# Patient Record
Sex: Female | Born: 1960
Health system: Southern US, Community
[De-identification: ages and names within clinical notes are randomized; demographics above are authoritative.]

## PROBLEM LIST (undated history)

## (undated) DIAGNOSIS — M549 Dorsalgia, unspecified: Secondary | ICD-10-CM

## (undated) DIAGNOSIS — I10 Essential (primary) hypertension: Secondary | ICD-10-CM

## (undated) DIAGNOSIS — M5126 Other intervertebral disc displacement, lumbar region: Secondary | ICD-10-CM

## (undated) DIAGNOSIS — R002 Palpitations: Secondary | ICD-10-CM

## (undated) DIAGNOSIS — N809 Endometriosis, unspecified: Secondary | ICD-10-CM

## (undated) DIAGNOSIS — E7800 Pure hypercholesterolemia, unspecified: Secondary | ICD-10-CM

## (undated) DIAGNOSIS — M5410 Radiculopathy, site unspecified: Secondary | ICD-10-CM

## (undated) HISTORY — DX: Other intervertebral disc displacement, lumbar region: M51.26

## (undated) HISTORY — DX: Endometriosis, unspecified: N80.9

## (undated) HISTORY — DX: Radiculopathy, site unspecified: M54.10

## (undated) HISTORY — DX: Pure hypercholesterolemia, unspecified: E78.00

## (undated) HISTORY — PX: OTHER SURGICAL HISTORY: SHX169

## (undated) HISTORY — PX: REDUCTION MAMMAPLASTY: SUR839

## (undated) HISTORY — DX: Palpitations: R00.2

## (undated) HISTORY — DX: Dorsalgia, unspecified: M54.9

---

## 1990-10-05 HISTORY — PX: LAPAROSCOPIC ENDOMETRIOSIS FULGURATION: SUR769

## 2010-10-05 HISTORY — PX: BREAST REDUCTION SURGERY: SHX8

## 2015-02-13 ENCOUNTER — Other Ambulatory Visit (HOSPITAL_COMMUNITY)
Admission: RE | Admit: 2015-02-13 | Discharge: 2015-02-13 | Disposition: A | Discharge status: Home / Self Care | Payer: Commercial Managed Care - PPO | Source: Ambulatory Visit | Attending: Obstetrics & Gynecology | Admitting: Obstetrics & Gynecology

## 2015-02-13 DIAGNOSIS — Z1151 Encounter for screening for human papillomavirus (HPV): Secondary | ICD-10-CM | POA: Diagnosis present

## 2015-02-13 DIAGNOSIS — Z01419 Encounter for gynecological examination (general) (routine) without abnormal findings: Secondary | ICD-10-CM | POA: Insufficient documentation

## 2015-02-18 ENCOUNTER — Other Ambulatory Visit: Payer: Self-pay

## 2015-02-18 DIAGNOSIS — Z1231 Encounter for screening mammogram for malignant neoplasm of breast: Secondary | ICD-10-CM

## 2015-03-15 ENCOUNTER — Ambulatory Visit: Payer: Self-pay

## 2015-04-16 ENCOUNTER — Ambulatory Visit: Payer: Commercial Managed Care - PPO

## 2015-04-19 ENCOUNTER — Ambulatory Visit: Payer: Commercial Managed Care - PPO

## 2015-05-24 ENCOUNTER — Ambulatory Visit: Payer: Commercial Managed Care - PPO

## 2015-07-03 ENCOUNTER — Ambulatory Visit: Payer: Commercial Managed Care - PPO

## 2015-08-02 ENCOUNTER — Ambulatory Visit
Admission: RE | Admit: 2015-08-02 | Discharge: 2015-08-02 | Disposition: A | Discharge status: Home / Self Care | Payer: Commercial Managed Care - PPO | Source: Ambulatory Visit

## 2015-08-02 DIAGNOSIS — Z1231 Encounter for screening mammogram for malignant neoplasm of breast: Secondary | ICD-10-CM

## 2015-08-14 ENCOUNTER — Ambulatory Visit: Payer: Commercial Managed Care - PPO | Admitting: Podiatry

## 2015-08-15 ENCOUNTER — Ambulatory Visit: Payer: Commercial Managed Care - PPO | Admitting: Podiatry

## 2015-11-10 ENCOUNTER — Emergency Department (HOSPITAL_BASED_OUTPATIENT_CLINIC_OR_DEPARTMENT_OTHER)
Admission: EM | Admit: 2015-11-10 | Discharge: 2015-11-10 | Disposition: A | Discharge status: Home / Self Care | Payer: Commercial Managed Care - PPO | Attending: Emergency Medicine | Admitting: Emergency Medicine

## 2015-11-10 ENCOUNTER — Encounter (HOSPITAL_BASED_OUTPATIENT_CLINIC_OR_DEPARTMENT_OTHER): Payer: Self-pay | Admitting: *Deleted

## 2015-11-10 ENCOUNTER — Emergency Department (HOSPITAL_BASED_OUTPATIENT_CLINIC_OR_DEPARTMENT_OTHER): Payer: Commercial Managed Care - PPO

## 2015-11-10 DIAGNOSIS — M62830 Muscle spasm of back: Secondary | ICD-10-CM | POA: Diagnosis not present

## 2015-11-10 DIAGNOSIS — S6992XA Unspecified injury of left wrist, hand and finger(s), initial encounter: Secondary | ICD-10-CM | POA: Insufficient documentation

## 2015-11-10 DIAGNOSIS — Y998 Other external cause status: Secondary | ICD-10-CM | POA: Insufficient documentation

## 2015-11-10 DIAGNOSIS — M79641 Pain in right hand: Secondary | ICD-10-CM

## 2015-11-10 DIAGNOSIS — I10 Essential (primary) hypertension: Secondary | ICD-10-CM | POA: Insufficient documentation

## 2015-11-10 DIAGNOSIS — M545 Low back pain, unspecified: Secondary | ICD-10-CM

## 2015-11-10 DIAGNOSIS — S3992XA Unspecified injury of lower back, initial encounter: Secondary | ICD-10-CM | POA: Diagnosis present

## 2015-11-10 DIAGNOSIS — Z79899 Other long term (current) drug therapy: Secondary | ICD-10-CM | POA: Diagnosis not present

## 2015-11-10 DIAGNOSIS — Y9389 Activity, other specified: Secondary | ICD-10-CM | POA: Insufficient documentation

## 2015-11-10 DIAGNOSIS — S6991XA Unspecified injury of right wrist, hand and finger(s), initial encounter: Secondary | ICD-10-CM | POA: Diagnosis not present

## 2015-11-10 DIAGNOSIS — M79642 Pain in left hand: Secondary | ICD-10-CM

## 2015-11-10 DIAGNOSIS — S0993XA Unspecified injury of face, initial encounter: Secondary | ICD-10-CM | POA: Diagnosis not present

## 2015-11-10 DIAGNOSIS — S29001A Unspecified injury of muscle and tendon of front wall of thorax, initial encounter: Secondary | ICD-10-CM | POA: Insufficient documentation

## 2015-11-10 DIAGNOSIS — Y92410 Unspecified street and highway as the place of occurrence of the external cause: Secondary | ICD-10-CM | POA: Diagnosis not present

## 2015-11-10 HISTORY — DX: Essential (primary) hypertension: I10

## 2015-11-10 MED ORDER — NAPROXEN 375 MG PO TABS: 375.0000 mg | ORAL_TABLET | Freq: Two times a day (BID) | ORAL | Status: DC

## 2015-11-10 MED ORDER — IBUPROFEN 400 MG PO TABS
400.0000 mg | ORAL_TABLET | Freq: Once | ORAL | Status: DC
  Filled 2015-11-10: qty 1

## 2015-11-10 MED ORDER — CYCLOBENZAPRINE HCL 5 MG PO TABS: 5.0000 mg | ORAL_TABLET | Freq: Two times a day (BID) | ORAL | Status: DC | PRN
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
## 2015-11-10 NOTE — Discharge Instructions (Signed)
Do not drive while taking the muscle relaxant as it will make you sleepy. Follow up with your doctor or return here as needed for problems.

## 2015-11-18 DIAGNOSIS — S335XXA Sprain of ligaments of lumbar spine, initial encounter: Secondary | ICD-10-CM | POA: Insufficient documentation

## 2015-11-18 DIAGNOSIS — S134XXA Sprain of ligaments of cervical spine, initial encounter: Secondary | ICD-10-CM | POA: Insufficient documentation

## 2016-02-20 ENCOUNTER — Ambulatory Visit (INDEPENDENT_AMBULATORY_CARE_PROVIDER_SITE_OTHER): Payer: Commercial Managed Care - PPO | Admitting: Neurology

## 2016-02-20 ENCOUNTER — Encounter: Payer: Self-pay | Admitting: Neurology

## 2016-02-20 VITALS — BP 108/63 | HR 70 | Ht 64.0 in | Wt 133.4 lb

## 2016-02-20 DIAGNOSIS — G5622 Lesion of ulnar nerve, left upper limb: Secondary | ICD-10-CM

## 2016-02-20 DIAGNOSIS — H02402 Unspecified ptosis of left eyelid: Secondary | ICD-10-CM | POA: Diagnosis not present

## 2016-02-20 DIAGNOSIS — R200 Anesthesia of skin: Secondary | ICD-10-CM

## 2016-02-20 DIAGNOSIS — H93A2 Pulsatile tinnitus, left ear: Secondary | ICD-10-CM

## 2016-02-20 DIAGNOSIS — R202 Paresthesia of skin: Secondary | ICD-10-CM | POA: Diagnosis not present

## 2016-02-20 DIAGNOSIS — R29898 Other symptoms and signs involving the musculoskeletal system: Secondary | ICD-10-CM

## 2016-02-20 DIAGNOSIS — I671 Cerebral aneurysm, nonruptured: Secondary | ICD-10-CM | POA: Diagnosis not present

## 2016-02-20 DIAGNOSIS — R42 Dizziness and giddiness: Secondary | ICD-10-CM | POA: Diagnosis not present

## 2016-02-20 DIAGNOSIS — H4902 Third [oculomotor] nerve palsy, left eye: Secondary | ICD-10-CM

## 2016-02-20 DIAGNOSIS — H905 Unspecified sensorineural hearing loss: Secondary | ICD-10-CM

## 2016-02-20 DIAGNOSIS — H9190 Unspecified hearing loss, unspecified ear: Secondary | ICD-10-CM
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
## 2016-02-20 NOTE — Patient Instructions (Addendum)
Remember to drink plenty of fluid, eat healthy meals and do not skip any meals. Try to eat protein with a every meal and eat a healthy snack such as fruit or nuts in between meals. Try to keep a regular sleep-wake schedule and try to exercise daily, particularly in the form of walking, 20-30 minutes a day, if you can.   As far as diagnostic testing: MRI of the brain and MRA of the head, emg/ncs, lab  Our phone number is [phone]. We also have an after hours call service for urgent matters and there is a physician on-call for urgent questions. For any emergencies you know to call 911 or go to the nearest emergency room

## 2016-02-21 ENCOUNTER — Ambulatory Visit: Payer: Commercial Managed Care - PPO | Admitting: Neurology

## 2016-02-21 ENCOUNTER — Telehealth: Payer: Self-pay

## 2016-02-21 LAB — BASIC METABOLIC PANEL
BUN / CREAT RATIO: 12 (ref 9–23)
BUN: 11 mg/dL (ref 6–24)
CHLORIDE: 103 mmol/L (ref 96–106)
CO2: 23 mmol/L (ref 18–29)
Calcium: 9.3 mg/dL (ref 8.7–10.2)
Creatinine, Ser: 0.94 mg/dL (ref 0.57–1.00)
GFR calc non Af Amer: 69 mL/min/{1.73_m2} (ref >59)
GFR, EST AFRICAN AMERICAN: 79 mL/min/{1.73_m2} (ref >59)
Glucose: 83 mg/dL (ref 65–99)
POTASSIUM: 4.5 mmol/L (ref 3.5–5.2)
Sodium: 142 mmol/L (ref 134–144)

## 2016-02-21 NOTE — Telephone Encounter (Signed)
-----   Message from Anson FretAntonia B Ahern, MD sent at 02/21/2016  7:58 AM EDT ----- Labs normal thanks

## 2016-02-21 NOTE — Telephone Encounter (Signed)
I spoke to patient and she is aware of results.  

## 2016-02-23 ENCOUNTER — Encounter: Payer: Self-pay | Admitting: Neurology

## 2016-02-23 DIAGNOSIS — R202 Paresthesia of skin: Secondary | ICD-10-CM | POA: Insufficient documentation

## 2016-02-23 DIAGNOSIS — H02409 Unspecified ptosis of unspecified eyelid: Secondary | ICD-10-CM | POA: Insufficient documentation

## 2016-02-26 ENCOUNTER — Other Ambulatory Visit: Payer: Commercial Managed Care - PPO

## 2016-02-27 ENCOUNTER — Telehealth: Payer: Self-pay | Admitting: Neurology

## 2016-02-27 NOTE — Telephone Encounter (Signed)
Called patient to inquire about which facility she wanted to be referred to for her MRI. Please ask her route message to me. Thanks!

## 2016-03-08 ENCOUNTER — Inpatient Hospital Stay: Admission: RE | Admit: 2016-03-08 | Payer: Commercial Managed Care - PPO | Source: Ambulatory Visit

## 2016-03-08 ENCOUNTER — Other Ambulatory Visit: Payer: Commercial Managed Care - PPO

## 2016-03-10 NOTE — Telephone Encounter (Signed)
Pt called to speak with Duwayne Heckanielle, sts out of pocket for MRI is $2000 and she is wanting to r/s to another facility. Please call

## 2016-03-13 ENCOUNTER — Ambulatory Visit (INDEPENDENT_AMBULATORY_CARE_PROVIDER_SITE_OTHER): Payer: Commercial Managed Care - PPO | Admitting: Diagnostic Neuroimaging

## 2016-03-13 ENCOUNTER — Encounter (INDEPENDENT_AMBULATORY_CARE_PROVIDER_SITE_OTHER): Payer: Self-pay | Admitting: Diagnostic Neuroimaging

## 2016-03-13 DIAGNOSIS — R200 Anesthesia of skin: Secondary | ICD-10-CM

## 2016-03-13 DIAGNOSIS — G5622 Lesion of ulnar nerve, left upper limb: Secondary | ICD-10-CM

## 2016-03-13 DIAGNOSIS — R202 Paresthesia of skin: Secondary | ICD-10-CM

## 2016-03-13 DIAGNOSIS — R29898 Other symptoms and signs involving the musculoskeletal system: Secondary | ICD-10-CM

## 2016-03-13 DIAGNOSIS — Z0289 Encounter for other administrative examinations: Secondary | ICD-10-CM

## 2016-03-13 NOTE — Procedures (Signed)
   [facility]  NCS (NERVE CONDUCTION STUDY) WITH EMG (ELECTROMYOGRAPHY) REPORT   STUDY DATE: 03/13/16 PATIENT NAME: Tonya Henry DOB: 1961-08-31 MRN: [id number]  ORDERING CLINICIAN: Joycelyn SchmidVikram Camarie Mctigue, MD   TECHNOLOGIST: Gearldine ShownLorraine Jones  ELECTROMYOGRAPHER: Glenford BayleyVikram R. Najma Bozarth, MD  CLINICAL INFORMATION: 55 year old female with left arm and left leg numbness and heaviness and pain.   FINDINGS: NERVE CONDUCTION STUDY: Bilateral median and ulnar motor responses and F wave latencies are normal. Bilateral median and ulnar sensory responses are normal.  Left peroneal, left tibial motor responses and F wave latencies are normal. Left peroneal sensory response is normal.   NEEDLE ELECTROMYOGRAPHY: Needle examination of left upper extremity ultra, biceps, triceps, flexor carpi radialis, first dorsal interosseous is normal. Patient denies neck pain and therefore cervical paraspinal muscles were deferred.   IMPRESSION:  This is a normal study. No electrodiagnostic evidence of large fiber neuropathy at this time.   INTERPRETING PHYSICIAN:  Suanne MarkerVIKRAM R. Mervyn Pflaum, MD Certified in Neurology, Neurophysiology and Neuroimaging  [facility] [address], [address] [place], [place] [address] [phone]

## 2016-03-13 NOTE — Telephone Encounter (Signed)
Call[facility] @ 8:30am left a VM with this information . DW

## 2016-03-17 ENCOUNTER — Telehealth: Payer: Self-pay | Admitting: *Deleted

## 2016-03-17 NOTE — Telephone Encounter (Signed)
Dr Lucia GaskinsAhern- can you place order for MRI cervical? Also, please advise.  Called and spoke to pt about results per Dr Lucia GaskinsAhern note below. Pt verbalized understanding and willing to have MRI cervical spine. She is questioning if she still needs to have MRI brain. Advised I will send phone note to Dr Lucia GaskinsAhern and call her back to let her know.

## 2016-03-17 NOTE — Telephone Encounter (Signed)
-----   Message from Anson FretAntonia B Ahern, MD sent at 03/16/2016  5:12 PM EDT ----- Kara MeadEmma, patient's emg/ncs was normal. No evidence for nerve damage or ulnar or median neuropathy. We could MRI her cervical spine next to ensure there is no radiculopathy/ pinched nerve in the neck Let me know how she would like to proceed. .Marland Kitchen

## 2016-03-18 ENCOUNTER — Other Ambulatory Visit: Payer: Self-pay | Admitting: Neurology

## 2016-03-18 DIAGNOSIS — R29898 Other symptoms and signs involving the musculoskeletal system: Secondary | ICD-10-CM

## 2016-03-18 DIAGNOSIS — R202 Paresthesia of skin: Secondary | ICD-10-CM

## 2016-03-18 DIAGNOSIS — M5010 Cervical disc disorder with radiculopathy, unspecified cervical region: Secondary | ICD-10-CM

## 2016-03-18 NOTE — Telephone Encounter (Signed)
Called and spoke to pt. Relayed Dr Lucia GaskinsAhern message below. Pt willing to do both. Advised pt she will be called to scheduled MRI cervical. She verbalized understanding.

## 2016-03-18 NOTE — Telephone Encounter (Signed)
I would do both as long as it is not prohibitively expensive.

## 2016-03-18 NOTE — Telephone Encounter (Signed)
Dr Ahern- FYI 

## 2017-06-23 ENCOUNTER — Ambulatory Visit (INDEPENDENT_AMBULATORY_CARE_PROVIDER_SITE_OTHER): Payer: BLUE CROSS/BLUE SHIELD | Admitting: Certified Nurse Midwife

## 2017-06-23 ENCOUNTER — Other Ambulatory Visit: Payer: Self-pay | Admitting: Certified Nurse Midwife

## 2017-06-23 ENCOUNTER — Other Ambulatory Visit (HOSPITAL_COMMUNITY)
Admission: RE | Admit: 2017-06-23 | Discharge: 2017-06-23 | Disposition: A | Discharge status: Home / Self Care | Payer: BLUE CROSS/BLUE SHIELD | Source: Ambulatory Visit | Attending: Certified Nurse Midwife | Admitting: Certified Nurse Midwife

## 2017-06-23 ENCOUNTER — Encounter: Payer: Self-pay | Admitting: Certified Nurse Midwife

## 2017-06-23 VITALS — BP 120/78 | HR 68 | Resp 16 | Ht 63.75 in | Wt 138.0 lb

## 2017-06-23 DIAGNOSIS — N951 Menopausal and female climacteric states: Secondary | ICD-10-CM

## 2017-06-23 DIAGNOSIS — Z01419 Encounter for gynecological examination (general) (routine) without abnormal findings: Secondary | ICD-10-CM | POA: Diagnosis not present

## 2017-06-23 DIAGNOSIS — Z124 Encounter for screening for malignant neoplasm of cervix: Secondary | ICD-10-CM

## 2017-06-23 DIAGNOSIS — Z1231 Encounter for screening mammogram for malignant neoplasm of breast: Secondary | ICD-10-CM

## 2017-06-23 NOTE — Patient Instructions (Signed)

## 2017-06-23 NOTE — Progress Notes (Signed)
Call to the [facility] while patient is in the office today. Breast Center offered appointment 9/26,9/27,9/28. Patient declines all appointments due to work schedule. Patient states she will then be going out of the country for 6 months. Declines to schedule mammogram at this time. States she will contact the Breast Center to schedule a mammogram at a later time.

## 2017-06-23 NOTE — Progress Notes (Signed)
56 y.o. G2P0 Single  African American Fe here to establish care and  for annual exam. Menopausal no vaginal bleeding or vaginal dryness. Sees Dr. Tenny Craw for aex, labs and medication management of hypertension, cholesterol and insomnia. All stable per patient. Patient aware mammogram due. Not sexually active. No other health issues today.  Patient's last menstrual period was 10/06/2007 (exact date).          Sexually active: No.  The current method of family planning is post menopausal status.    Exercising: No.  exercise Smoker:  no  Health Maintenance: Pap:  ago neg per patient History of Abnormal Pap: no MMG:  08-02-15 category b density birads 1:neg Self Breast exams: yes Colonoscopy:  2006 f/u 20yrs  BMD:   none TDaP:  2016 Shingles: no Pneumonia: no Hep C and HIV: not done Labs: if needed   reports that she has never smoked. She has never used smokeless tobacco. She reports that she does not drink alcohol or use drugs.  Past Medical History:  Diagnosis Date  . Back pain   . Endometriosis   . High cholesterol   . Hypertension   . Lumbar herniated disc    L4 & 5  . Radicular syndrome     Past Surgical History:  Procedure Laterality Date  . BREAST REDUCTION SURGERY  2012  . LAPAROSCOPIC ENDOMETRIOSIS FULGURATION  1992  . pinguecula removal  09/2015, 12/2005    Current Outpatient Prescriptions  Medication Sig Dispense Refill  . amLODipine (NORVASC) 5 MG tablet Take 5 mg by mouth daily.    Marland Kitchen gabapentin (NEURONTIN) 300 MG capsule Take 300 mg by mouth as needed.    Marland Kitchen lisinopril (PRINIVIL,ZESTRIL) 5 MG tablet Take 5 mg by mouth daily.    . metoprolol succinate (TOPROL-XL) 25 MG 24 hr tablet   3  . zolpidem (AMBIEN) 10 MG tablet as needed.  0   No current facility-administered medications for this visit.     Family History  Problem Relation Age of Onset  . Diabetes Mother   . Hypertension Mother   . Heart disease Mother   . Hypertension Unknown   . Kidney  disease Unknown   . Diabetes Unknown   . Breast cancer Unknown   . Breast cancer Sister     ROS:  Pertinent items are noted in HPI.  Otherwise, a comprehensive ROS was negative.  Exam:   BP 120/78   Pulse 68   Resp 16   Ht 5' 3.75" (1.619 m)   Wt 138 lb (62.6 kg)   LMP 10/06/2007 (Exact Date)   BMI 23.87 kg/m  Height: 5' 3.75" (161.9 cm) Ht Readings from Last 3 Encounters:  06/23/17 5' 3.75" (1.619 m)  02/20/16  (1.626 m)  11/10/15  (1.626 m)    General appearance: alert, cooperative and appears stated age Head: Normocephalic, without obvious abnormality, atraumatic Neck: no adenopathy, supple, symmetrical, trachea midline and thyroid normal to inspection and palpation Lungs: clear to auscultation bilaterally Breasts: normal appearance, no masses or tenderness, No nipple retraction or dimpling, No nipple discharge or bleeding, No axillary or supraclavicular adenopathy, breast reduction scarring noted bilateral Heart: regular rate and rhythm Abdomen: soft, non-tender; no masses,  no organomegaly Extremities: extremities normal, atraumatic, no cyanosis or edema Skin: Skin color, texture, turgor normal. No rashes or lesions Lymph nodes: Cervical, supraclavicular, and axillary nodes normal. No abnormal inguinal nodes palpated Neurologic: Grossly normal   Pelvic: External genitalia:  no lesions  Urethra:  normal appearing urethra with no masses, tenderness or lesions              Bartholin's and Skene's: normal                 Vagina: normal appearing vagina with normal color and discharge, no lesions              Cervix: no bleeding following Pap, no cervical motion tenderness, no lesions and nulliparous appearance              Pap taken: Yes.   Bimanual Exam:  Uterus:  normal size, contour, position, consistency, mobility, non-tender              Adnexa: normal adnexa and no mass, fullness, tenderness               Rectovaginal: Confirms                Anus:  normal sphincter tone, no lesions  Chaperone present: yes  A:  Well Woman with normal exam  Menopausal no HRT  Mammogram due  Colonoscopy due  Hypertension,cholesterol/insomina with PCP management  Family history of breast cancer sister age 55 genetic screening unknown  P:   Reviewed health and wellness pertinent to exam  Discussed importance of being sure to advise if vaginal bleeding, now that she is menopausal.  Patient will be scheduled prior to leaving today.  Patient request referral for colonoscopy after discussion of risks/benefits  Continue follow up with PCP as indicated  Discussed genetic screening available due to history of breast cancer in family. Patient will consider.  Pap smear: yes   counseled on breast self exam, mammography screening, feminine hygiene, menopause, adequate intake of calcium and vitamin D, diet and exercise  return annually or prn  An After Visit Summary was printed and given to the patient.

## 2017-06-25 LAB — CYTOLOGY - PAP
Diagnosis: NEGATIVE
HPV (WINDOPATH): NOT DETECTED

## 2017-07-02 ENCOUNTER — Ambulatory Visit
Admission: RE | Admit: 2017-07-02 | Discharge: 2017-07-02 | Disposition: A | Discharge status: Home / Self Care | Payer: BLUE CROSS/BLUE SHIELD | Source: Ambulatory Visit | Attending: Certified Nurse Midwife | Admitting: Certified Nurse Midwife

## 2017-07-02 ENCOUNTER — Ambulatory Visit: Payer: Commercial Managed Care - PPO

## 2017-07-02 ENCOUNTER — Other Ambulatory Visit: Payer: Self-pay | Admitting: Certified Nurse Midwife

## 2017-07-02 DIAGNOSIS — Z1231 Encounter for screening mammogram for malignant neoplasm of breast: Secondary | ICD-10-CM

## 2017-08-13 ENCOUNTER — Telehealth: Payer: Self-pay | Admitting: Certified Nurse Midwife

## 2017-08-13 NOTE — Telephone Encounter (Signed)
Left message to call Noreene LarssonJill at [phone].   Notes recorded by Verner CholLeonard, Deborah S, CNM on 06/26/2017 at 5:31 PM EDT Pap smear reviewed negative. HPVHR not detected 02  Notes recorded by Verner CholLeonard, Deborah S, CNM on 07/02/2017 at 5:24 PM EDT Mammogram reviewed negative birads a Density b Yearly mammogram

## 2017-08-13 NOTE — Telephone Encounter (Signed)
Return call to Jill. °

## 2017-08-13 NOTE — Telephone Encounter (Signed)
Spoke with patient, states she did not receive PAP and MMG results. Advised of results as seen below. Patient verbalizes understanding and is agreeable.   Routing to provider for final review. Patient is agreeable to disposition. Will close encounter.

## 2017-08-13 NOTE — Telephone Encounter (Signed)
Patient is calling for pap and mammogram results.

## 2017-09-03 ENCOUNTER — Telehealth: Payer: Self-pay | Admitting: Certified Nurse Midwife

## 2017-09-03 DIAGNOSIS — Z1211 Encounter for screening for malignant neoplasm of colon: Secondary | ICD-10-CM

## 2017-09-03 NOTE — Telephone Encounter (Signed)
Patient called and requested a referral for a colonoscopy.

## 2017-09-03 NOTE — Telephone Encounter (Signed)
Spoke with patient. Request referral for colonoscopy discussed at last AEX on 06/23/17 with Leota Sauerseborah Leonard, CNM.   Referral placed for Dr. Loreta AveMann for colonoscopy. Advised patient will have their office call directly to schedule. Patient verbalizes understanding and is agreeable.  Order placed for referral to Dr. Loreta AveMann.

## 2017-09-03 NOTE — Telephone Encounter (Signed)
Spoke with Misty StanleyLisa, referral for screening colonoscopy. Fax OV notes, demographics, insurance card, referral to [phone], will call patient directly to schedule.   Requested information faxed.   Routing to provider for final review. Patient is agreeable to disposition. Will close encounter.   Cc: Harland DingwallSuzy Dixon

## 2017-12-31 DIAGNOSIS — R202 Paresthesia of skin: Secondary | ICD-10-CM | POA: Diagnosis not present

## 2017-12-31 DIAGNOSIS — R292 Abnormal reflex: Secondary | ICD-10-CM | POA: Diagnosis not present

## 2018-01-14 DIAGNOSIS — M50221 Other cervical disc displacement at C4-C5 level: Secondary | ICD-10-CM | POA: Diagnosis not present

## 2018-01-14 DIAGNOSIS — R202 Paresthesia of skin: Secondary | ICD-10-CM | POA: Diagnosis not present

## 2018-01-14 DIAGNOSIS — M47812 Spondylosis without myelopathy or radiculopathy, cervical region: Secondary | ICD-10-CM | POA: Diagnosis not present

## 2018-01-14 DIAGNOSIS — R200 Anesthesia of skin: Secondary | ICD-10-CM | POA: Diagnosis not present

## 2018-06-29 ENCOUNTER — Ambulatory Visit: Payer: 59 | Admitting: Certified Nurse Midwife

## 2018-06-29 ENCOUNTER — Other Ambulatory Visit: Payer: Self-pay

## 2018-06-29 ENCOUNTER — Encounter: Payer: Self-pay | Admitting: Certified Nurse Midwife

## 2018-06-29 VITALS — BP 104/64 | HR 68 | Resp 16 | Ht 63.75 in | Wt 135.0 lb

## 2018-06-29 DIAGNOSIS — E782 Mixed hyperlipidemia: Secondary | ICD-10-CM | POA: Diagnosis not present

## 2018-06-29 DIAGNOSIS — R0683 Snoring: Secondary | ICD-10-CM | POA: Diagnosis not present

## 2018-06-29 DIAGNOSIS — Z01419 Encounter for gynecological examination (general) (routine) without abnormal findings: Secondary | ICD-10-CM | POA: Diagnosis not present

## 2018-06-29 DIAGNOSIS — Z131 Encounter for screening for diabetes mellitus: Secondary | ICD-10-CM | POA: Diagnosis not present

## 2018-06-29 DIAGNOSIS — N951 Menopausal and female climacteric states: Secondary | ICD-10-CM | POA: Diagnosis not present

## 2018-06-29 DIAGNOSIS — Z0000 Encounter for general adult medical examination without abnormal findings: Secondary | ICD-10-CM | POA: Diagnosis not present

## 2018-06-29 DIAGNOSIS — I10 Essential (primary) hypertension: Secondary | ICD-10-CM | POA: Diagnosis not present

## 2018-06-29 NOTE — Progress Notes (Signed)
57 y.o. G2P0 Single  African American Fe here for annual exam. Menopausal with occasional hot flashes, and night sweats. Sees Dr. Tenny Craw for aex/labs and management of Toprol and Norvasc.No vaginal dryness issues or vaginal bleeding. Has been good year. No health issues today.  Patient's last menstrual period was 10/06/2007 (exact date).          Sexually active: No.  The current method of family planning is post menopausal status.    Exercising: Yes.    gym Smoker:  no  Review of Systems  Constitutional: Negative.   HENT: Negative.   Eyes: Negative.   Respiratory: Negative.   Cardiovascular: Negative.   Gastrointestinal: Negative.   Genitourinary: Negative.   Musculoskeletal: Negative.   Skin: Negative.   Neurological: Negative.   Endo/Heme/Allergies: Negative.   Psychiatric/Behavioral: Negative.     Health Maintenance: Pap:  06-23-17 neg HPV HR neg History of Abnormal Pap: no MMG:  07-02-17 category b density birads 1:neg Self Breast exams: yes Colonoscopy:  2018 f/u 32yrs BMD:   none TDaP:  2016 Shingles: no Pneumonia: no Hep C and HIV: both neg 2015 per patient Labs: no   reports that she has never smoked. She has never used smokeless tobacco. She reports that she does not drink alcohol or use drugs.  Past Medical History:  Diagnosis Date  . Back pain   . Endometriosis   . High cholesterol   . Hypertension   . Lumbar herniated disc    L4 & 5  . Radicular syndrome     Past Surgical History:  Procedure Laterality Date  . BREAST REDUCTION SURGERY  2012  . LAPAROSCOPIC ENDOMETRIOSIS FULGURATION  1992  . pinguecula removal  09/2015, 12/2005  . REDUCTION MAMMAPLASTY Bilateral     Current Outpatient Medications  Medication Sig Dispense Refill  . amLODipine (NORVASC) 5 MG tablet Take 5 mg by mouth daily.    Marland Kitchen gabapentin (NEURONTIN) 300 MG capsule Take 300 mg by mouth as needed.    Marland Kitchen lisinopril (PRINIVIL,ZESTRIL) 5 MG tablet Take 5 mg by mouth daily.    .  metoprolol succinate (TOPROL-XL) 25 MG 24 hr tablet   3  . zolpidem (AMBIEN) 10 MG tablet as needed.  0   No current facility-administered medications for this visit.     Family History  Problem Relation Age of Onset  . Diabetes Mother   . Hypertension Mother   . Heart disease Mother   . Hypertension Unknown   . Kidney disease Unknown   . Diabetes Unknown   . Breast cancer Unknown   . Breast cancer Sister 85    ROS:  Pertinent items are noted in HPI.  Otherwise, a comprehensive ROS was negative.  Exam:   LMP 10/06/2007 (Exact Date)    Ht Readings from Last 3 Encounters:  06/23/17 5' 3.75" (1.619 m)  02/20/16 5\' 4"  (1.626 m)  11/10/15 5\' 4"  (1.626 m)    General appearance: alert, cooperative and appears stated age Head: Normocephalic, without obvious abnormality, atraumatic Neck: no adenopathy, supple, symmetrical, trachea midline and thyroid normal to inspection and palpation Lungs: clear to auscultation bilaterally Breasts: normal appearance, no masses or tenderness, No nipple retraction or dimpling, No nipple discharge or bleeding, No axillary or supraclavicular adenopathy, breast reduction scarring bilateral Heart: regular rate and rhythm Abdomen: soft, non-tender; no masses,  no organomegaly Extremities: extremities normal, atraumatic, no cyanosis or edema Skin: Skin color, texture, turgor normal. No rashes or lesions Lymph nodes: Cervical, supraclavicular, and axillary nodes  normal. No abnormal inguinal nodes palpated Neurologic: Grossly normal   Pelvic: External genitalia:  no lesions              Urethra:  normal appearing urethra with no masses, tenderness or lesions              Bartholin's and Skene's: normal                 Vagina: normal appearing vagina with normal color and discharge, no lesions              Cervix: no cervical motion tenderness, no lesions and normal appearance              Pap taken: No. Bimanual Exam:  Uterus:  normal size, contour,  position, consistency, mobility, non-tender and anteverted              Adnexa: normal adnexa and no mass, fullness, tenderness               Rectovaginal: Confirms               Anus:  normal sphincter tone, no lesions  Chaperone present: yes  A:  Well Woman with normal exam  Menopausal symptomatic, declines HRT  MD management of medical problems of hypertension and Toprol  P:   Reviewed health and wellness pertinent to exam  Aware of need to advise if vaginal bleeding  Continue follow up with PCP as indicated  Pap smear: no   counseled on breast self exam, mammography screening, feminine hygiene, adequate intake of calcium and vitamin D, diet and exercise, Kegel's exercises  return annually or prn  An After Visit Summary was printed and given to the patient.

## 2018-06-29 NOTE — Patient Instructions (Signed)

## 2018-07-17 DIAGNOSIS — Z23 Encounter for immunization: Secondary | ICD-10-CM | POA: Diagnosis not present

## 2018-08-01 ENCOUNTER — Other Ambulatory Visit: Payer: Self-pay | Admitting: Certified Nurse Midwife

## 2018-08-01 DIAGNOSIS — Z1231 Encounter for screening mammogram for malignant neoplasm of breast: Secondary | ICD-10-CM

## 2018-09-07 ENCOUNTER — Ambulatory Visit: Payer: BLUE CROSS/BLUE SHIELD

## 2018-10-12 ENCOUNTER — Ambulatory Visit: Payer: 59

## 2018-11-16 ENCOUNTER — Ambulatory Visit
Admission: RE | Admit: 2018-11-16 | Discharge: 2018-11-16 | Disposition: A | Discharge status: Home / Self Care | Payer: 59 | Source: Ambulatory Visit | Attending: Certified Nurse Midwife | Admitting: Certified Nurse Midwife

## 2018-11-16 DIAGNOSIS — Z1231 Encounter for screening mammogram for malignant neoplasm of breast: Secondary | ICD-10-CM

## 2019-05-16 ENCOUNTER — Telehealth: Payer: Self-pay | Admitting: Certified Nurse Midwife

## 2019-05-16 NOTE — Telephone Encounter (Signed)
Patient returned call

## 2019-05-16 NOTE — Telephone Encounter (Signed)
Patient is having right breast pain. 

## 2019-05-16 NOTE — Telephone Encounter (Signed)
Left message to call Kishan Wachsmuth, RN at [facility] [phone].   

## 2019-05-16 NOTE — Telephone Encounter (Signed)
Spoke with patient. Patient reports right breast pain for 1-2 months. Denies lumps, nipple d/c, recent injury, redness, swelling, fever/chills. 11/16/18 screening MMG normal. Recommended OV for further evaluation. [id number] prescreen negative, precautions reviewed. OV scheduled for 8/12 at 1pm with Melvia Heaps, CNM.   Routing to provider for final review. Patient is agreeable to disposition. Will close encounter.

## 2019-05-17 ENCOUNTER — Telehealth: Payer: Self-pay | Admitting: *Deleted

## 2019-05-17 ENCOUNTER — Ambulatory Visit: Payer: 59 | Admitting: Certified Nurse Midwife

## 2019-05-17 ENCOUNTER — Encounter: Payer: Self-pay | Admitting: Certified Nurse Midwife

## 2019-05-17 ENCOUNTER — Other Ambulatory Visit: Payer: Self-pay

## 2019-05-17 VITALS — BP 110/70 | HR 68 | Temp 97.4°F | Resp 16 | Wt 140.0 lb

## 2019-05-17 DIAGNOSIS — N6310 Unspecified lump in the right breast, unspecified quadrant: Secondary | ICD-10-CM

## 2019-05-17 NOTE — Telephone Encounter (Signed)
-----   Message from Regina Eck, CNM sent at 05/17/2019  1:36 PM EDT ----- Patient has right breast mass tenderness noted, no sign of infection.  She is aware she will be called with diagnostic mammogram and [place] appointment information for evaluation. See [facility] is where she has been seen

## 2019-05-17 NOTE — Telephone Encounter (Signed)
Spoke with Anderson Malta at [facility]. Patient scheduled for right breast Dx MMG and [place], if needed, on 8/13 at 12:50pm, arrive at 12:30pm.   Call placed to patient, left detailed message, ok per dpr. Advised of appt details as seen above. Advised patient if she needs to make any changes to appt to contact [facility] directly at [phone], return call to office if any additional questions.   Routing to [employer], CNM to sign off on breast imaging orders.

## 2019-05-17 NOTE — Progress Notes (Addendum)
   Subjective:   58 y.o. Single African American female presents for evaluation of Right breast tenderness.Onset of the symptoms was approximately a week ago or more. Has continued to feel tender or sharp pain occasional. Denies redness, hot to touch or nipple discharge.  Contributing factors include sister with breast cancer history. Patient had bilateral breast reduction surgery in 2012, with no issues. Denies chills, fevers and sweats. Patient denies history of trauma, bites, or injuries. Denies excessive caffeine use or recent weight lifting. No underwire bra use. Last mammogram was 11/16/2018 which showed normal breast tissue, density C with fibroglandular areas noted. Patient feels this is a change for her and is worried. No other concerns today.     Review of Systems  Pertinent items noted in HPI and remainder of comprehensive ROS otherwise negative.   Objective:   General appearance: alert, appears stated age and no distress Breasts: normal appearance, no masses or tenderness, No nipple retraction or dimpling, No nipple discharge or bleeding, No axillary or supraclavicular adenopathy,  RIght breast at 8-9 o'clock 3 FB from outer edge small mobile marble size mass, tender to touch, no redness on skin, patient palpated and agreed this is the area of tendernss. Area marked. No axillary tenderness or enlarged lymph nodes palpated bilateral. Physical Exam Chest:         Assessment:   ASSESSMENT:Patient is diagnosed with right tender breast mass, no infection appearance, and breast tenderness. History of bilateral reduction surgery. Family history of breast cancer sister age 63   Plan:   PLAN: Discussed findings with patient and need for diagnostic mammogram and US of the right breast. She will be called with information regarding appointment. Warning signs with breast tenderness, such as redness or temperature or nipple discharge should be reported immediately. Avoid tight or snug bra  and massaging area. Patient agreeable. Avoid caffeine which can increase tenderness. Questions addressed.  Rv prn as above

## 2019-05-18 ENCOUNTER — Other Ambulatory Visit: Payer: 59

## 2019-05-19 ENCOUNTER — Telehealth: Payer: Self-pay | Admitting: Certified Nurse Midwife

## 2019-05-19 NOTE — Telephone Encounter (Signed)
Patient says she is returning a call back to [place] about an ultrasound appointment. Please call work number [phone].

## 2019-05-19 NOTE — Telephone Encounter (Signed)
Call to patient. Patient states she was unaware of appointment scheduled on 8/13. Patient asking what test was ordered. RN advised right diagnostic MMG and [place] if needed for right breast mass. Patient agreeable. States she can do any day after 1630. RN advised would check with [facility] for availability.

## 2019-05-19 NOTE — Telephone Encounter (Signed)
Spoke with Tonya Henry at [facility]. States the latest appointment for diagnostic MMG is 1530 and first available is on 06-02-2019. Advised do not do Saturday diagnostic MMG appointments. RN advised would update patient.   Returned call to patient. Message given to patient as seen above. Advised patient to contact [facility] directly to schedule. Number to [facility] provided to patient.

## 2019-05-22 NOTE — Telephone Encounter (Signed)
Patient is scheduled for Diagnostic MMG on 05-26-2019. Will await results.  Routing to provider and will close encounter.

## 2019-05-26 ENCOUNTER — Other Ambulatory Visit: Payer: Self-pay

## 2019-05-26 ENCOUNTER — Ambulatory Visit
Admission: RE | Admit: 2019-05-26 | Discharge: 2019-05-26 | Disposition: A | Discharge status: Home / Self Care | Payer: 59 | Source: Ambulatory Visit | Attending: Certified Nurse Midwife | Admitting: Certified Nurse Midwife

## 2019-05-26 DIAGNOSIS — N6310 Unspecified lump in the right breast, unspecified quadrant: Secondary | ICD-10-CM

## 2019-05-29 ENCOUNTER — Telehealth: Payer: Self-pay | Admitting: *Deleted

## 2019-05-29 NOTE — Telephone Encounter (Signed)
Notes recorded by Burnice Logan, RN on 05/29/2019 at 11:43 AM EDT  Left message to call Sharee Pimple, RN at [facility].

## 2019-05-29 NOTE — Telephone Encounter (Signed)
-----   Message from Regina Eck, CNM sent at 05/28/2019  1:29 PM EDT ----- Notify patient that area of concern was noted to be fat lobule. This is what I felt and thought it could be related to previous surgery. No discrete masses noted.Normal fibrofatty tissue seen area of concern. Also distortion from scarring from prior reduction surgery, unchanged from 2016. Has the tenderness resolved. If not she needs to be seen again.  Do SBE monthly and if any change needs to come in. Mammogram yearly

## 2019-06-05 NOTE — Telephone Encounter (Signed)
Spoke with patient, advised per Melvia Heaps, CNM. Patient reports breast tenderness has not resolved. Recommended OV for further evaluation. Patient states she will return call later today to schedule, needs to look at her schedule. Patient verbalizes understanding.

## 2019-06-07 NOTE — Telephone Encounter (Signed)
Patient returned call

## 2019-06-07 NOTE — Telephone Encounter (Signed)
Message left to return call to Triage Nurse at [phone].    

## 2019-06-07 NOTE — Telephone Encounter (Signed)
Left message to call Sharee Pimple, RN at [place].    Patient needs F/u breast check  Next AEX 07/05/19

## 2019-06-14 NOTE — Telephone Encounter (Addendum)
Patient is scheduled for AEX on 07/05/19, ok to do breast recheck at this OV?

## 2019-06-14 NOTE — Telephone Encounter (Signed)
Call to patient. Patient advised would do breast recheck at aex. Patient comfortable with plan. Patient advised if tenderness worsens prior to appointment to return call to the office. Patient agreeable.   Routing to provider and will close encounter.

## 2019-06-14 NOTE — Telephone Encounter (Signed)
Ok to do at aex

## 2019-06-22 ENCOUNTER — Ambulatory Visit (INDEPENDENT_AMBULATORY_CARE_PROVIDER_SITE_OTHER): Payer: 59 | Admitting: Cardiology

## 2019-06-22 ENCOUNTER — Encounter: Payer: Self-pay | Admitting: Cardiology

## 2019-06-22 ENCOUNTER — Telehealth: Payer: Self-pay | Admitting: *Deleted

## 2019-06-22 ENCOUNTER — Other Ambulatory Visit: Payer: Self-pay

## 2019-06-22 VITALS — BP 116/78 | HR 84 | Temp 97.2°F | Ht 64.0 in | Wt 141.8 lb

## 2019-06-22 DIAGNOSIS — R002 Palpitations: Secondary | ICD-10-CM

## 2019-06-22 DIAGNOSIS — Z7189 Other specified counseling: Secondary | ICD-10-CM | POA: Diagnosis not present

## 2019-06-22 DIAGNOSIS — I10 Essential (primary) hypertension: Secondary | ICD-10-CM | POA: Diagnosis not present

## 2019-06-22 DIAGNOSIS — E7800 Pure hypercholesterolemia, unspecified: Secondary | ICD-10-CM

## 2019-06-22 DIAGNOSIS — I491 Atrial premature depolarization: Secondary | ICD-10-CM | POA: Diagnosis not present

## 2019-06-22 NOTE — Progress Notes (Signed)
Cardiology Office Note:    Date:  06/22/2019   ID:  Tonya Henry, DOB 04/26/1961, MRN [id number]  PCP:  Lawerance Cruel, MD  Cardiologist:  Buford Dresser, MD PhD  Referring MD: Lawerance Cruel, MD   CC: new consult for evaluation of palpitations  History of Present Illness:    Tonya Henry is a 58 y.o. female with a hx of HTN, HLD who is seen as a new consult at the request of Lawerance Cruel, MD for the evaluation and management of palpitations.  Tachycardia/palpitations: -Initial onset: about 1 year ago -Frequency/Duration: more frequent now, every other day. Last about 2-3 minutes, longest 10 minutes -Associated symptoms: like head is spinning, but no chest pain, shortness of breath, nausea, diaphoresis -Aggravating/alleviating factors: no clear associated factors, is better if she sits down -Syncope/near syncope: none -Prior cardiac history: none -Prior ECG: SR, PACS -Prior workup: none -Prior treatment: metoprolol, inderal, atenolol in the past -Possible medication interactions: none -Caffeine: very little -Alcohol: none -Tobacco: never -OTC supplements: multivitamin, vitamin D -Comorbidities: HTN -Exercise level: -Labs: TSH, kidney function/electrolytes, CBC reviewed. -Cardiac ROS: no chest pain, no shortness of breath, no PND, no orthopnea, no LE edema. -Family history: both parents with heart disease late in life  Also concerned about shortness of breath with activity: nonlimiting, notices with more moderate-heavy activity. Used to [employer], etc but mostly walking. We reviewed her risk factors and prevention strategy as well as her overall CV risk. She will continue to monitor.  Denies chest pain, shortness of breath at rest or with light exertion. No PND, orthopnea, LE edema or unexpected weight gain. No syncope.  Past Medical History:  Diagnosis Date  . Back pain   . Endometriosis   . High cholesterol   . Hypertension   . Lumbar herniated disc    L4 & 5  . Radicular syndrome     Past Surgical History:  Procedure Laterality Date  . BREAST REDUCTION SURGERY  2012  . LAPAROSCOPIC ENDOMETRIOSIS FULGURATION  1992  . pinguecula removal  09/2015, 12/2005  . REDUCTION MAMMAPLASTY Bilateral     Current Medications: Current Outpatient Medications on File Prior to Visit  Medication Sig  . amLODipine (NORVASC) 5 MG tablet Take 5 mg by mouth daily.  . metoprolol succinate (TOPROL-XL) 25 MG 24 hr tablet    No current facility-administered medications on file prior to visit.      Allergies:   Patient has no known allergies.   Social History   Socioeconomic History  . Marital status: Single    Spouse name: Not on file  . Number of children: 2  . Years of education: Not on file  . Highest education level: Not on file  Occupational History  . 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 Financial resource strain: Not on file  . Food insecurity    Worry: Not on file    Inability: Not on file  . Transportation needs    Medical: Not on file    Non-medical: Not on file  Tobacco Use  . Smoking status: Never Smoker  . Smokeless tobacco: Never Used  Substance and Sexual Activity  . Alcohol use: No    Alcohol/week: 0.0 standard drinks  . Drug use: No  . Sexual activity: Not Currently    Partners: Male    Birth control/protection: Post-menopausal  Lifestyle  . Physical activity    Days per week: Not on file    Minutes per session:  Not on file  . Stress: Not on file  Relationships  . Social Musicianconnections    Talks on phone: Not on file    Gets together: Not on file    Attends religious service: Not on file    Active member of club or organization: Not on file    Attends meetings of clubs or organizations: Not on file    Relationship status: Not on file  Other Topics Concern  . Not on file  Social History Narrative   Lives with son   Caffeine use: 1/2 cup coffee per day     Family History:  The patient's family history includes Breast cancer in an other family member; Breast cancer (age of onset: 1052) in her sister; Diabetes in her mother and another family member; Heart disease in her mother; Hypertension in her mother and another family member; Kidney disease in an other family member.  ROS:   Please see the history of present illness.  Additional pertinent ROS: Constitutional: Negative for chills, fever, night sweats, unintentional weight loss  HENT: Negative for ear pain and hearing loss.   Eyes: Negative for loss of vision and eye pain.  Respiratory: Negative for cough, sputum, wheezing.   Cardiovascular: See HPI. Gastrointestinal: Negative for abdominal pain, melena, and hematochezia.  Genitourinary: Negative for dysuria and hematuria.  Musculoskeletal: Negative for falls and myalgias.  Skin: Negative for itching and rash.  Neurological: Negative for focal weakness, focal sensory changes and loss of consciousness.  Endo/Heme/Allergies: Does not bruise/bleed easily.     EKGs/Labs/Other Studies Reviewed:    The following studies were reviewed today: Dr. Charlott Rakesoss's notes send in records  EKG:  EKG is personally reviewed.  The ekg ordered today demonstrates SR with PACs  Recent Labs: No results found for requested labs within last 8760 hours.  Recent Lipid Panel No results found for: CHOL, TRIG, HDL, CHOLHDL, VLDL, LDLCALC, LDLDIRECT  Physical Exam:    VS:  BP 116/78   Pulse 84   Temp (!) 97.2 F (36.2 C)   Ht 5\' 4"  (1.626 m)   Wt 141 lb 12.8 oz (64.3 kg)   LMP 10/06/2007 (Exact Date)   SpO2 100%   BMI 24.34 kg/m     Wt Readings from Last 3 Encounters:  06/22/19 141 lb 12.8 oz (64.3 kg)  05/17/19 140 lb (63.5 kg)  06/29/18 135 lb (61.2 kg)    GEN: Well nourished, well developed in no acute distress HEENT: Normal, moist mucous membranes NECK: No JVD CARDIAC: alternates between regular rhythm and what sounds like atrial trigeminy, normal S1 and S2, no rubs,  gallops. 1/6 SEM VASCULAR: Radial and DP pulses 2+ bilaterally. No carotid bruits RESPIRATORY:  Clear to auscultation without rales, wheezing or rhonchi  ABDOMEN: Soft, non-tender, non-distended MUSCULOSKELETAL:  Ambulates independently SKIN: Warm and dry, no edema NEUROLOGIC:  Alert and oriented x 3. No focal neuro deficits noted. PSYCHIATRIC:  Normal affect    ASSESSMENT:    1. Palpitation   2. PAC (premature atrial contraction)   3. Cardiac risk counseling   4. Counseling on health promotion and disease prevention   5. Essential hypertension   6. Pure hypercholesterolemia    PLAN:    Palpitations, with PAC on ECG and what sounds like intermittent atrial trigeminy on exam: -will get 14 day Zio to assess burden -continue metoprolol -next step/further evaluation based on results of monitor  Hypertension: at goal today -continue amlodipine  Hypercholesterolemia: LDL on labs from 06/2018 was 144. She  is not sure if she was on intermittent statin at that time or not. Has trialed atorvastatin and rosuvastatin in the past. Has discussed with her PCP, declines statin discussion at this time.   Cardiac risk counseling and prevention recommendations: -recommend heart healthy/Mediterranean diet, with whole grains, fruits, vegetable, fish, lean meats, nuts, and olive oil. Limit salt. -recommend moderate walking, 3-5 times/week for 30-50 minutes each session. Aim for at least 150 minutes.week. Goal should be pace of 3 miles/hours, or walking 1.5 miles in 30 minutes -recommend avoidance of tobacco products. Avoid excess alcohol.  Plan for follow up: 2 mos or sooner PRN  Medication Adjustments/Labs and Tests Ordered: Current medicines are reviewed at length with the patient today.  Concerns regarding medicines are outlined above.  Orders Placed This Encounter  Procedures  . LONG TERM MONITOR (3-14 DAYS)  . EKG 12-Lead  . ECHOCARDIOGRAM COMPLETE   No orders of the defined types were  placed in this encounter.   Patient Instructions  Medication Instructions:  Your Physician recommend you continue on your current medication as directed.    If you need a refill on your cardiac medications before your next appointment, please call your pharmacy.   Lab work: None  Testing/Procedures: Our physician has recommended that you wear an 14 DAY ZIO-PATCH monitor. The Zio patch cardiac monitor continuously records heart rhythm data for up to 14 days, this is for patients being evaluated for multiple types heart rhythms. For the first 24 hours post application, please avoid getting the Zio monitor wet in the shower or by excessive sweating during exercise. After that, feel free to carry on with regular activities. Keep soaps and lotions away from the ZIO XT Patch.   This will be placed at our [facility] location - [address].     Your physician has requested that you have an echocardiogram. Echocardiography is a painless test that uses sound waves to create images of your heart. It provides your doctor with information about the size and shape of your heart and how well your heart's chambers and valves are working. This procedure takes approximately one hour. There are no restrictions for this procedure. [address]   Follow-Up: At [facility], you and your health needs are our priority.  As part of our continuing mission to provide you with exceptional heart care, we have created designated Provider Care Teams.  These Care Teams include your primary Cardiologist (physician) and Advanced Practice Providers (APPs -  Physician Assistants and Nurse Practitioners) who all work together to provide you with the care you need, when you need it. You will need a follow up appointment in 2 months.  Please call our office 2 months in advance to schedule this appointment.  You may see Dr. Cristal Deer or one of the following Advanced Practice Providers on your  designated Care Team:   Theodore Demark, PA-C . Joni Reining, DNP, ANP          Signed, Jodelle Red, MD PhD 06/22/2019  [facility]

## 2019-06-22 NOTE — Patient Instructions (Addendum)
Medication Instructions:  Your Physician recommend you continue on your current medication as directed.    If you need a refill on your cardiac medications before your next appointment, please call your pharmacy.   Lab work: None  Testing/Procedures: Our physician has recommended that you wear an [place] monitor. The Zio patch cardiac monitor continuously records heart rhythm data for up to 14 days, this is for patients being evaluated for multiple types heart rhythms. For the first 24 hours post application, please avoid getting the Zio monitor wet in the shower or by excessive sweating during exercise. After that, feel free to carry on with regular activities. Keep soaps and lotions away from the ZIO XT Patch.   This will be placed at our [facility] location - [address], [address].     Your physician has requested that you have an echocardiogram. Echocardiography is a painless test that uses sound waves to create images of your heart. It provides your doctor with information about the size and shape of your heart and how well your heart's chambers and valves are working. This procedure takes approximately one hour. There are no restrictions for this procedure. [place] [address]   Follow-Up: At [facility], you and your health needs are our priority.  As part of our continuing mission to provide you with exceptional heart care, we have created designated Provider Care Teams.  These Care Teams include your primary Cardiologist (physician) and Advanced Practice Providers (APPs -  Physician Assistants and Nurse Practitioners) who all work together to provide you with the care you need, when you need it. You will need a follow up appointment in 2 months.  Please call our office 2 months in advance to schedule this appointment.  You may see Dr. Harrell Gave or one of the following Advanced Practice Providers on your designated Care Team:   Rosaria Ferries,  PA-C . Jory Sims, DNP, ANP

## 2019-06-22 NOTE — Telephone Encounter (Signed)
14 day ZIO XT Long term holter monitor to be mailed to the patients home.  Instructions reviewed briefly as they are included in the monitor kit.  Do not apply monitor until after Echocardigram on 06/30/2019.

## 2019-06-23 ENCOUNTER — Encounter: Payer: Self-pay | Admitting: Cardiology

## 2019-06-23 DIAGNOSIS — R002 Palpitations: Secondary | ICD-10-CM | POA: Insufficient documentation

## 2019-06-23 DIAGNOSIS — I491 Atrial premature depolarization: Secondary | ICD-10-CM | POA: Insufficient documentation

## 2019-06-30 ENCOUNTER — Ambulatory Visit (INDEPENDENT_AMBULATORY_CARE_PROVIDER_SITE_OTHER): Payer: 59

## 2019-06-30 ENCOUNTER — Ambulatory Visit (HOSPITAL_COMMUNITY): Payer: 59 | Attending: Cardiology

## 2019-06-30 ENCOUNTER — Other Ambulatory Visit: Payer: Self-pay

## 2019-06-30 DIAGNOSIS — R002 Palpitations: Secondary | ICD-10-CM

## 2019-07-03 ENCOUNTER — Other Ambulatory Visit: Payer: Self-pay

## 2019-07-04 NOTE — Progress Notes (Signed)
58 y.o. G2P0 Single  African American Fe here for annual exam. Post menopausal no hot flashes or night sweats. Some insomnia at bedtime. Working on the wind down prior to sleep. Sees Dr Harrington Challenger for aex, labs and Hypertension/cholesterol management. Seeing cardiology for heart sound changes, wearing monitoring device now. No physical symptoms noted. Had [place] of right breast in 05/2019 for tenderness all normal. Tenderness has resolved. No other health concerns today.  Patient's last menstrual period was 10/06/2007 (exact date).          Sexually active: No.  The current method of family planning is post menopausal status.    Exercising: Yes.    zumba Smoker:  no  Review of Systems  Constitutional: Negative.   HENT: Negative.   Eyes: Negative.   Respiratory: Negative.   Cardiovascular: Negative.   Gastrointestinal: Negative.   Genitourinary: Negative.   Musculoskeletal: Negative.   Skin: Negative.   Neurological: Negative.   Endo/Heme/Allergies: Negative.   Psychiatric/Behavioral: Negative.     Health Maintenance: Pap:  06-23-17 neg HPV HR neg History of Abnormal Pap: no MMG:  Bilateral 11/2018, rt breast us/ & rt axilla u/s category b density birads 2:neg Self Breast exams: yes Colonoscopy:  2018 f/u 48yrs BMD:  none TDaP: 2016 Shingles: no Pneumonia: no Hep C and HIV: both neg 2015 per patient Labs: PCP   reports that she has never smoked. She has never used smokeless tobacco. She reports that she does not drink alcohol or use drugs.  Past Medical History:  Diagnosis Date  . Back pain   . Endometriosis   . High cholesterol   . Hypertension   . Lumbar herniated disc    L4 & 5  . Radicular syndrome     Past Surgical History:  Procedure Laterality Date  . BREAST REDUCTION SURGERY  2012  . LAPAROSCOPIC ENDOMETRIOSIS FULGURATION  1992  . pinguecula removal  09/2015, 12/2005  . REDUCTION MAMMAPLASTY Bilateral     Current Outpatient Medications  Medication Sig Dispense  Refill  . amLODipine (NORVASC) 5 MG tablet Take 5 mg by mouth daily.    . metoprolol succinate (TOPROL-XL) 25 MG 24 hr tablet   3   No current facility-administered medications for this visit.     Family History  Problem Relation Age of Onset  . Diabetes Mother   . Hypertension Mother   . Heart disease Mother   . Hypertension Other   . Kidney disease Other   . Diabetes Other   . Breast cancer Other   . Breast cancer Sister 78    ROS:  Pertinent items are noted in HPI.  Otherwise, a comprehensive ROS was negative.  Exam:   LMP 10/06/2007 (Exact Date)    Ht Readings from Last 3 Encounters:  06/22/19 5\' 4"  (1.626 m)  06/29/18 5' 3.75" (1.619 m)  06/23/17 5' 3.75" (1.619 m)    General appearance: alert, cooperative and appears stated age Head: Normocephalic, without obvious abnormality, atraumatic Neck: no adenopathy, supple, symmetrical, trachea midline and thyroid normal to inspection and palpation Lungs: clear to auscultation bilaterally Breasts: normal appearance, no masses or tenderness, No nipple retraction or dimpling, No nipple discharge or bleeding, No axillary or supraclavicular adenopathy area of concern in right breast not palpable today or tenderness. Bilateral scarring from breast reduction Heart: regular rate, with occasional change noted in sound, no irregular rhythm. Abdomen: soft, non-tender; no masses,  no organomegaly Extremities: extremities normal, atraumatic, no cyanosis or edema Skin: Skin color, texture, turgor  normal. No rashes or lesions Lymph nodes: Cervical, supraclavicular, and axillary nodes normal. No abnormal inguinal nodes palpated Neurologic: Grossly normal   Pelvic: External genitalia:  no lesions              Urethra:  normal appearing urethra with no masses, tenderness or lesions              Bartholin's and Skene's: normal                 Vagina: normal appearing vagina with normal color and discharge, no lesions              Cervix:  no cervical motion tenderness, no lesions and normal appearance              Pap taken: No. Bimanual Exam:  Uterus:  normal size, contour, position, consistency, mobility, non-tender and anteverted              Adnexa: normal adnexa and no mass, fullness, tenderness               Rectovaginal: Confirms               Anus:  normal sphincter tone, no lesions  Chaperone present: yes  A:  Well Woman with normal exam  Menopausal no HRT  History of Right breast mass, tenderness, scar changes and fibroglandular density noted. No palpable areas today.  Cholesterol/hypertension management with PCP, cardiac monitor now for cardiology evaluation of change  P:   Reviewed health and wellness pertinent to exam  Aware of need to advise if vaginal bleeding.  Discussed no changes noted right breast today, no tenderness noted.  Continue to follow up with PCP/cardiology as indicated.    Pap smear: no  counseled on breast self exam, mammography screening, feminine hygiene, menopause, adequate intake of calcium and vitamin D, diet and exercise  return annually or prn  An After Visit Summary was printed and given to the patient.

## 2019-07-05 ENCOUNTER — Encounter: Payer: Self-pay | Admitting: Certified Nurse Midwife

## 2019-07-05 ENCOUNTER — Other Ambulatory Visit: Payer: Self-pay

## 2019-07-05 ENCOUNTER — Ambulatory Visit: Payer: 59 | Admitting: Certified Nurse Midwife

## 2019-07-05 VITALS — BP 106/68 | HR 68 | Temp 97.2°F | Resp 16 | Ht 63.25 in | Wt 139.0 lb

## 2019-07-05 DIAGNOSIS — Z01419 Encounter for gynecological examination (general) (routine) without abnormal findings: Secondary | ICD-10-CM | POA: Diagnosis not present

## 2019-07-05 DIAGNOSIS — N951 Menopausal and female climacteric states: Secondary | ICD-10-CM | POA: Diagnosis not present

## 2019-07-05 DIAGNOSIS — Z8679 Personal history of other diseases of the circulatory system: Secondary | ICD-10-CM | POA: Diagnosis not present

## 2019-07-05 NOTE — Patient Instructions (Signed)

## 2019-08-14 ENCOUNTER — Telehealth: Payer: Self-pay | Admitting: *Deleted

## 2019-08-14 NOTE — Telephone Encounter (Signed)
05/26/2019 right breast DX MMG and [place] for palpable lump and tenderness; negative, annual bilateral screening in 11/2019  Patient was seen in office on 07/05/19 by Melvia Heaps, CNM for breast recheck. Breast tenderness resolved.    Dr. Sabra Heck -ok to remove from [facility] hold

## 2019-08-15 NOTE — Telephone Encounter (Signed)
Patient removed from MMG hold.   Encounter closed.  

## 2019-08-15 NOTE — Telephone Encounter (Signed)
Yes, remove from MMG hold.  Thanks. 

## 2019-08-29 ENCOUNTER — Telehealth (INDEPENDENT_AMBULATORY_CARE_PROVIDER_SITE_OTHER): Payer: 59 | Admitting: Cardiology

## 2019-08-29 ENCOUNTER — Encounter: Payer: Self-pay | Admitting: Cardiology

## 2019-08-29 VITALS — BP 131/73 | HR 74 | Ht 64.0 in | Wt 141.0 lb

## 2019-08-29 DIAGNOSIS — R002 Palpitations: Secondary | ICD-10-CM

## 2019-08-29 DIAGNOSIS — I471 Supraventricular tachycardia, unspecified: Secondary | ICD-10-CM | POA: Insufficient documentation

## 2019-08-29 DIAGNOSIS — R0789 Other chest pain: Secondary | ICD-10-CM

## 2019-08-29 DIAGNOSIS — Z712 Person consulting for explanation of examination or test findings: Secondary | ICD-10-CM | POA: Insufficient documentation

## 2019-08-29 NOTE — Progress Notes (Signed)
Virtual Visit via Telephone Note   This visit type was conducted due to national recommendations for restrictions regarding the COVID-19 Pandemic (e.g. social distancing) in an effort to limit this patient's exposure and mitigate transmission in our community.  Due to her co-morbid illnesses, this patient is at least at moderate risk for complications without adequate follow up.  This format is felt to be most appropriate for this patient at this time.  The patient did not have access to video technology/had technical difficulties with video requiring transitioning to audio format only (telephone).  All issues noted in this document were discussed and addressed.  No physical exam could be performed with this format.  Please refer to the patient's chart for her  consent to telehealth for [facility].   Date:  08/29/2019   ID:  Tonya Henry, DOB 06-Jun-1961, MRN [id number]  Patient Location: Home Provider Location: Office  PCP:  Daisy Floro, MD  Cardiologist:  Jodelle Red, MD  Electrophysiologist:  None   Evaluation Performed:  Follow-Up Visit  Chief Complaint:  Discuss results of monitor  History of Present Illness:    Tonya Henry is a 58 y.o. female with PMH hypertension, hyperlipidemia. I initially saw her for palpitations on 06/22/19. She has since completed a monitor, reviewed today.  The patient does not have symptoms concerning for COVID-19 infection (fever, chills, cough, or new shortness of breath).   Today: Reviewed results of monitor in depth with her. Predominantly sinus rhythm, but did have 40 SVT events. Longest was less than 30 seconds. At the time she was wearing the monitor, she was on metoprolol succinate 25 mg daily. She has since increased to 50 mg daily and has felt better. We discussed SVT and management strategies, reviewed below.  On review of systems, she did endorse recent chest pain. Over the last few weeks, she has had 1x week at most, last  10 minutes, mild. Nonexertional. Has had in the distnat past, thought it was heart burn. Has tried maalox, not sure if that makes it better or if it goes away on its own. No radiation, no associated symptoms. We discussed workup/red flag signs today.   Past Medical History:  Diagnosis Date  . Back pain   . Endometriosis   . High cholesterol   . Hypertension   . Lumbar herniated disc    L4 & 5  . Palpitations   . Radicular syndrome    Past Surgical History:  Procedure Laterality Date  . BREAST REDUCTION SURGERY  2012  . LAPAROSCOPIC ENDOMETRIOSIS FULGURATION  1992  . pinguecula removal  09/2015, 12/2005  . REDUCTION MAMMAPLASTY Bilateral      Current Meds  Medication Sig  . amLODipine (NORVASC) 5 MG tablet Take 5 mg by mouth daily.  Marland Kitchen lisinopril (ZESTRIL) 5 MG tablet TAKE 1 TABLET BY MOUTH 2 3 TIMES PER WEEK  . [DISCONTINUED] metoprolol succinate (TOPROL-XL) 25 MG 24 hr tablet      Allergies:   Patient has no known allergies.   Social History   Tobacco Use  . Smoking status: Never Smoker  . Smokeless tobacco: Never Used  Substance Use Topics  . Alcohol use: No    Alcohol/week: 0.0 standard drinks  . Drug use: No     Family Hx: The patient's family history includes Breast cancer in an other family member; Breast cancer (age of onset: 63) in her sister; Diabetes in her mother and another family member; Heart disease in her mother; Hypertension in  her mother and another family member; Kidney disease in an other family member.  ROS:   Please see the history of present illness.    All other systems reviewed and are negative.   Prior CV studies:   The following studies were reviewed today: Monitor 07/31/19 13 days of data recorded on Zio monitor. Patient had a min HR of 58 bpm, max HR of 207 bpm, and avg HR of 88 bpm. Predominant underlying rhythm was Sinus Rhythm. No atrial fibrillation, high degree block, or pauses noted. Isolated ventricular ectopy was rare (<1%).  There were 12 triggered events, which were predominantly sinus rhythm with PACs. Monitor labeled one event as NSVT, but appears to be SVT with aberrancy as rate was 207 beats per minute (only 7 beats long). Similar morphology to sinus beats, but QRS slightly wide, consistent with rate related aberrancy. 40 Supraventricular Tachycardia runs occurred, the run with the fastest interval lasting 4 beats with a max rate of 187 bpm, the longest lasting 24.3 secs with an avg rate of 120 bpm. PACs were frequent (5.6%, 91001).  Echo 06/30/19  1. Left ventricular ejection fraction, by visual estimation, is 60 to 65%. The left ventricle has normal function. Normal left ventricular size. There is no left ventricular hypertrophy.  2. Left ventricular diastolic Doppler parameters are consistent with impaired relaxation pattern of LV diastolic filling.  3. Global right ventricle has normal systolic function.The right ventricular size is normal.  4. Left atrial size was normal.  5. Right atrial size was normal.  6. The mitral valve is normal in structure. Trace mitral valve regurgitation. No evidence of mitral stenosis.  7. The tricuspid valve is normal in structure. Tricuspid valve regurgitation is trivial.  8. The aortic valve is normal in structure. Aortic valve regurgitation was not visualized by color flow Doppler. Structurally normal aortic valve, with no evidence of sclerosis or stenosis.  9. The pulmonic valve was normal in structure. Pulmonic valve regurgitation is not visualized by color flow Doppler. 10. Normal pulmonary artery systolic pressure. 11. The inferior vena cava is normal in size with greater than 50% respiratory variability, suggesting right atrial pressure of 3 mmHg. 12. Normal LV systolic function; grade 1 diastolic dysfunction; mild proximal septal thickening.   Labs/Other Tests and Data Reviewed:    EKG:  An ECG dated 06/22/19 was personally reviewed today and demonstrated:  SR with PACs   Recent Labs: No results found for requested labs within last 8760 hours.   Recent Lipid Panel No results found for: CHOL, TRIG, HDL, CHOLHDL, LDLCALC, LDLDIRECT  Wt Readings from Last 3 Encounters:  08/29/19 141 lb (64 kg)  07/05/19 139 lb (63 kg)  06/22/19 141 lb 12.8 oz (64.3 kg)     Objective:    Vital Signs:  BP 131/73   Pulse 74   Ht 5\' 4"  (1.626 m)   Wt 141 lb (64 kg)   LMP 10/06/2007 (Exact Date)   BMI 24.20 kg/m    Speaking comfortably on the phone, no audible wheezing In no acute distress Alert and oriented Normal affect Normal speech  ASSESSMENT & PLAN:    Results of monitor: PACs, paroxysmal SVT -discussed monitor results, above -echo unremarkable -discussed options, including medical management vs. Referral for ablation. She feels better on 50 mg metoprolol succinate, continue for now. Does not want to consider ablation at this time, follow symptoms and reassess  Chest pain: mild, atypical, but does have risk factors -discussed options (monitoring vs. Workup). She will  monitor. -instructed if symptoms become more frequent or intense, she needs to let me know and we will proceed with additional workup -instructed on red flag warning signs that need immediate medical attention  No change today: Hypertension: within range of goal Hyperlipidemia: has declined statin, continue to optimize diet and exercise  COVID-19 Education: The signs and symptoms of COVID-19 were discussed with the patient and how to seek care for testing (follow up with PCP or arrange E-visit).  The importance of social distancing was discussed today.  Time:   Today, I have spent 11 minutes with the patient with telehealth technology discussing the above problems.     Medication Adjustments/Labs and Tests Ordered: Current medicines are reviewed at length with the patient today.  Concerns regarding medicines are outlined above.   Patient Instructions  Medication Instructions:  Your  Physician recommend you continue on your current medication as directed.    *If you need a refill on your cardiac medications before your next appointment, please call your pharmacy*  Lab Work: None  Testing/Procedures: None  Follow-Up: At [facility], you and your health needs are our priority.  As part of our continuing mission to provide you with exceptional heart care, we have created designated Provider Care Teams.  These Care Teams include your primary Cardiologist (physician) and Advanced Practice Providers (APPs -  Physician Assistants and Nurse Practitioners) who all work together to provide you with the care you need, when you need it.  Your next appointment:   3 month(s)  The format for your next appointment:   Virtual Visit   Provider:   Buford Dresser, MD     Follow Up:  3 mos or sooner PRN  Signed, Buford Dresser, MD  08/29/2019 5:33 PM    [place]

## 2019-08-29 NOTE — Patient Instructions (Signed)
Medication Instructions:  Your Physician recommend you continue on your current medication as directed.    *If you need a refill on your cardiac medications before your next appointment, please call your pharmacy*  Lab Work: None  Testing/Procedures: None  Follow-Up: At [facility], you and your health needs are our priority.  As part of our continuing mission to provide you with exceptional heart care, we have created designated Provider Care Teams.  These Care Teams include your primary Cardiologist (physician) and Advanced Practice Providers (APPs -  Physician Assistants and Nurse Practitioners) who all work together to provide you with the care you need, when you need it.  Your next appointment:  3 month(s)  The format for your next appointment:   Virtual Visit   Provider:   Bridgette Christopher, MD      

## 2019-11-14 ENCOUNTER — Telehealth: Payer: Self-pay | Admitting: *Deleted

## 2019-11-14 NOTE — Telephone Encounter (Signed)
A message was left, re: her follow up visit. 

## 2019-12-18 ENCOUNTER — Encounter: Payer: Self-pay | Admitting: Cardiology

## 2019-12-25 ENCOUNTER — Encounter: Payer: Self-pay | Admitting: Certified Nurse Midwife

## 2019-12-26 NOTE — Telephone Encounter (Signed)
Sent message to patient    Good afternoon,Ms Tonya Henry   Thank you for bring to this letter to our attention. Our scheduling department periodically send letters out for follow up appointments it was possible error with the computer system or human error not recognizing a virtual visit with an in office visit.  You are correct that you had a virtual visit with Dr Cristal Deer in Nov 2020. Reviewing  Dr Di Kindle visit note, She did reference that she would like to have another  Virtual visit  In 3 months . Which would had place the visit to be  The end of Feb 2021.  If you would like to schedule a virtual appointment  Yo may do so  Through mychart or call the office.   Thanks [employer]

## 2020-01-11 ENCOUNTER — Other Ambulatory Visit: Payer: Self-pay | Admitting: Certified Nurse Midwife

## 2020-01-11 DIAGNOSIS — Z1231 Encounter for screening mammogram for malignant neoplasm of breast: Secondary | ICD-10-CM

## 2020-01-12 ENCOUNTER — Other Ambulatory Visit: Payer: Self-pay

## 2020-01-12 ENCOUNTER — Ambulatory Visit
Admission: RE | Admit: 2020-01-12 | Discharge: 2020-01-12 | Disposition: A | Discharge status: Home / Self Care | Payer: 59 | Source: Ambulatory Visit | Attending: *Deleted | Admitting: *Deleted

## 2020-01-12 DIAGNOSIS — Z1231 Encounter for screening mammogram for malignant neoplasm of breast: Secondary | ICD-10-CM

## 2020-02-02 ENCOUNTER — Encounter: Payer: Self-pay | Admitting: Cardiology

## 2020-02-02 ENCOUNTER — Ambulatory Visit: Payer: 59 | Admitting: Cardiology

## 2020-02-02 ENCOUNTER — Other Ambulatory Visit: Payer: Self-pay

## 2020-02-02 VITALS — BP 110/60 | HR 65 | Ht 64.0 in | Wt 137.0 lb

## 2020-02-02 DIAGNOSIS — R002 Palpitations: Secondary | ICD-10-CM

## 2020-02-02 DIAGNOSIS — I10 Essential (primary) hypertension: Secondary | ICD-10-CM | POA: Diagnosis not present

## 2020-02-02 DIAGNOSIS — I471 Supraventricular tachycardia: Secondary | ICD-10-CM | POA: Diagnosis not present

## 2020-02-02 DIAGNOSIS — E7800 Pure hypercholesterolemia, unspecified: Secondary | ICD-10-CM | POA: Diagnosis not present

## 2020-02-02 DIAGNOSIS — Z7189 Other specified counseling: Secondary | ICD-10-CM

## 2020-02-02 NOTE — Patient Instructions (Signed)
Medication Instructions:  Continue current medications  *If you need a refill on your cardiac medications before your next appointment, please call your pharmacy*   Lab Work: None Ordered  Testing/Procedures: None Ordered   Follow-Up: At [facility], you and your health needs are our priority.  As part of our continuing mission to provide you with exceptional heart care, we have created designated Provider Care Teams.  These Care Teams include your primary Cardiologist (physician) and Advanced Practice Providers (APPs -  Physician Assistants and Nurse Practitioners) who all work together to provide you with the care you need, when you need it.  We recommend signing up for the patient portal called "MyChart".  Sign up information is provided on this After Visit Summary.  MyChart is used to connect with patients for Virtual Visits (Telemedicine).  Patients are able to view lab/test results, encounter notes, upcoming appointments, etc.  Non-urgent messages can be sent to your provider as well.   To learn more about what you can do with MyChart, go to [email or web address]://[email or web address].    Your next appointment:   1 year(s)  The format for your next appointment:   In Person  Provider:   You may see Bridgette Christopher, MD or one of the following Advanced Practice Providers on your designated Care Team:    Rhonda Barrett, PA-C  Kathryn Lawrence, DNP, ANP  Cadence Furth, NP    

## 2020-02-02 NOTE — Progress Notes (Signed)
Cardiology Office Note:    Date:  02/02/2020   ID:  Gudrun Axe, DOB August 16, 1961, MRN [id number]  PCP:  Daisy Floro, MD  Cardiologist:  Jodelle Red, MD PhD  Referring MD: Daisy Floro, MD   CC: follow up  History of Present Illness:    Tonya Henry is a 59 y.o. female with a hx of HTN, HLD who is seen for follow up. I initially saw her 06/22/19 as a new consult at the request of Daisy Floro, MD for the evaluation and management of palpitations.  Today: Still having occasional episodes of SVT, worse with coffee/caffeine. Improved frequency from initial. Longest episode less than a minute. Tries to break with movement, discussed vagal maneuvers.  Stopped lisinopril completely when GFR got to 48. Also reports abnormal liver function tests. Done recently by the PA in Dr. Charlott Rakes office. She will send me a copy of the labs.  No more chest pain. No syncope.  Past Medical History:  Diagnosis Date  . Back pain   . Endometriosis   . High cholesterol   . Hypertension   . Lumbar herniated disc    L4 & 5  . Palpitations   . Radicular syndrome     Past Surgical History:  Procedure Laterality Date  . BREAST REDUCTION SURGERY  2012  . LAPAROSCOPIC ENDOMETRIOSIS FULGURATION  1992  . pinguecula removal  09/2015, 12/2005  . REDUCTION MAMMAPLASTY Bilateral     Current Medications: Current Outpatient Medications on File Prior to Visit  Medication Sig  . amLODipine (NORVASC) 5 MG tablet Take 5 mg by mouth daily.  . metoprolol succinate (TOPROL-XL) 50 MG 24 hr tablet Take 50 mg by mouth daily.   No current facility-administered medications on file prior to visit.     Allergies:   Patient has no known allergies.   Social History   Tobacco Use  . Smoking status: Never Smoker  . Smokeless tobacco: Never Used  Substance Use Topics  . Alcohol use: No    Alcohol/week: 0.0 standard drinks  . Drug use: No    Family History: The patient's family history  includes Breast cancer in an other family member; Breast cancer (age of onset: 30) in her sister; Diabetes in her mother and another family member; Heart disease in her mother; Hypertension in her mother and another family member; Kidney disease in an other family member.  ROS:   Please see the history of present illness.  Additional pertinent ROS otherwise unremarkable.   EKGs/Labs/Other Studies Reviewed:    The following studies were reviewed today: Monitor 07/31/19 13 days of data recorded on Zio monitor. Patient had a min HR of 58 bpm, max HR of 207 bpm, and avg HR of 88 bpm. Predominant underlying rhythm was Sinus Rhythm. No atrial fibrillation, high degree block, or pauses noted. Isolated ventricular ectopy was rare (<1%). There were 12 triggered events, which were predominantly sinus rhythm with PACs. Monitor labeled one event as NSVT, but appears to be SVT with aberrancy as rate was 207 beats per minute (only 7 beats long). Similar morphology to sinus beats, but QRS slightly wide, consistent with rate related aberrancy. 40 Supraventricular Tachycardia runs occurred, the run with the fastest interval lasting 4 beats with a max rate of 187 bpm, the longest lasting 24.3 secs with an avg rate of 120 bpm. PACs were frequent (5.6%, 91001).  Echo 06/30/19 1. Left ventricular ejection fraction, by visual estimation, is 60 to 65%. The left ventricle has normal  function. Normal left ventricular size. There is no left ventricular hypertrophy. 2. Left ventricular diastolic Doppler parameters are consistent with impaired relaxation pattern of LV diastolic filling. 3. Global right ventricle has normal systolic function.The right ventricular size is normal. 4. Left atrial size was normal. 5. Right atrial size was normal. 6. The mitral valve is normal in structure. Trace mitral valve regurgitation. No evidence of mitral stenosis. 7. The tricuspid valve is normal in structure. Tricuspid valve  regurgitation is trivial. 8. The aortic valve is normal in structure. Aortic valve regurgitation was not visualized by color flow Doppler. Structurally normal aortic valve, with no evidence of sclerosis or stenosis. 9. The pulmonic valve was normal in structure. Pulmonic valve regurgitation is not visualized by color flow Doppler. 10. Normal pulmonary artery systolic pressure. 11. The inferior vena cava is normal in size with greater than 50% respiratory variability, suggesting right atrial pressure of 3 mmHg. 12. Normal LV systolic function; grade 1 diastolic dysfunction; mild proximal septal thickening.  EKG:  EKG is personally reviewed.  The ekg ordered 06/22/19 demonstrates SR with PACs  Recent Labs: No results found for requested labs within last 8760 hours.  Recent Lipid Panel No results found for: CHOL, TRIG, HDL, CHOLHDL, VLDL, LDLCALC, LDLDIRECT  Physical Exam:    VS:  BP 110/60   Pulse 65   Ht 5\' 4"  (1.626 m)   Wt 137 lb (62.1 kg)   LMP 10/06/2007 (Exact Date)   SpO2 99%   BMI 23.52 kg/m     Wt Readings from Last 3 Encounters:  02/02/20 137 lb (62.1 kg)  08/29/19 141 lb (64 kg)  07/05/19 139 lb (63 kg)    GEN: Well nourished, well developed in no acute distress HEENT: Normal, moist mucous membranes NECK: No JVD CARDIAC: regular rhythm, normal S1 and S2, no rubs or gallops. 1/6 SE murmur. VASCULAR: Radial and DP pulses 2+ bilaterally. No carotid bruits RESPIRATORY:  Clear to auscultation without rales, wheezing or rhonchi  ABDOMEN: Soft, non-tender, non-distended MUSCULOSKELETAL:  Ambulates independently SKIN: Warm and dry, no edema NEUROLOGIC:  Alert and oriented x 3. No focal neuro deficits noted. PSYCHIATRIC:  Normal affect   ASSESSMENT:    1. Palpitation   2. Paroxysmal SVT (supraventricular tachycardia) ([place])   3. Essential hypertension   4. Pure hypercholesterolemia   5. Cardiac risk counseling   6. Counseling on health promotion and disease prevention     PLAN:    Palpitations:  -echo unremarkable -Zio with paroxysmal SVT -decreased on metoprolol, feels that they are manageable -if worse, will contact me, will discuss medication changes vs. Ablation.  Hypertension: at goal today -continue amlodipine, lisinopril, metoprolol  Hypercholesterolemia: see prior notes, declines statin  Cardiac risk counseling and prevention recommendations: -recommend heart healthy/Mediterranean diet, with whole grains, fruits, vegetable, fish, lean meats, nuts, and olive oil. Limit salt. -recommend moderate walking, 3-5 times/week for 30-50 minutes each session. Aim for at least 150 minutes.week. Goal should be pace of 3 miles/hours, or walking 1.5 miles in 30 minutes -recommend avoidance of tobacco products. Avoid excess alcohol.  Plan for follow up: 1 year or sooner as needed  Medication Adjustments/Labs and Tests Ordered: Current medicines are reviewed at length with the patient today.  Concerns regarding medicines are outlined above.  No orders of the defined types were placed in this encounter.  No orders of the defined types were placed in this encounter.   Patient Instructions  Medication Instructions:  Continue current medications  *If you need  a refill on your cardiac medications before your next appointment, please call your pharmacy*   Lab Work: None Ordered   Testing/Procedures: None Ordered   Follow-Up: At [facility], you and your health needs are our priority.  As part of our continuing mission to provide you with exceptional heart care, we have created designated Provider Care Teams.  These Care Teams include your primary Cardiologist (physician) and Advanced Practice Providers (APPs -  Physician Assistants and Nurse Practitioners) who all work together to provide you with the care you need, when you need it.  We recommend signing up for the patient portal called "MyChart".  Sign up information is provided on this After  Visit Summary.  MyChart is used to connect with patients for Virtual Visits (Telemedicine).  Patients are able to view lab/test results, encounter notes, upcoming appointments, etc.  Non-urgent messages can be sent to your provider as well.   To learn more about what you can do with MyChart, go to [email or web address].    Your next appointment:   1 year(s)  The format for your next appointment:   In Person  Provider:   You may see Jodelle Red, MD or one of the following Advanced Practice Providers on your designated Care Team:    Theodore Demark, PA-C  Joni Reining, DNP, ANP  Cadence Fransico Michael, NP        Signed, Jodelle Red, MD PhD 02/02/2020  [facility]

## 2020-03-14 ENCOUNTER — Encounter: Payer: Self-pay | Admitting: Cardiology

## 2020-04-23 ENCOUNTER — Telehealth: Payer: Self-pay

## 2020-04-23 NOTE — Telephone Encounter (Signed)
Patient is calling in regards to vaginal spotting. Patient states she has not had a period in years.

## 2020-04-23 NOTE — Telephone Encounter (Signed)
Spoke with patient. Patient reports spotting for 2 days, notices when wiping. LMP approximately 20 years ago. Not on HRT. Denies pain, heavy bleeding, urinary symptoms, vag odor, fever/chills, N/V.   Last AEX 07/05/19 w/ Leota Sauers, CNM   OV scheduled for 04/26/20 at 10:30am with Dr. Edward Jolly. Patient declines OV on 7/21. Advised patient to continue to monitor symptoms, return call to office if bleeding becomes heavy or any new symptoms develop. Provider will review, our office will return call if any additional recommendations. Patient agreeable.   Routing to provider for final review. Patient is agreeable to disposition. Will close encounter.

## 2020-04-25 ENCOUNTER — Telehealth: Payer: Self-pay | Admitting: Obstetrics and Gynecology

## 2020-04-25 NOTE — Telephone Encounter (Signed)
Left message to call Muzamil Harker, RN at [facility] [phone].   

## 2020-04-25 NOTE — Telephone Encounter (Signed)
Please do contact patient back to reschedule her appointment for postmenopausal bleeding.

## 2020-04-25 NOTE — Telephone Encounter (Signed)
Patient canceled her appointment for PMB due to work-will call back to reschedule

## 2020-04-26 ENCOUNTER — Ambulatory Visit: Payer: Self-pay | Admitting: Obstetrics and Gynecology

## 2020-05-02 NOTE — Telephone Encounter (Signed)
Spoke with pt. Pt states will have to call back to reschedule appt due to not having Aug work schedule.

## 2020-05-17 NOTE — Telephone Encounter (Signed)
Spoke with pt. Pt called to reschedule PMB appt with Dr Edward Jolly. Pt states available on 8/20. Pt advised Dr Edward Jolly not in office that day. Pt declines another provider. Pt states has jury duty on 8/24 and will call back after that to reschedule.   Routing to Dr Edward Jolly for update.

## 2020-05-21 NOTE — Telephone Encounter (Signed)
Please reach out to the patient regarding her report of postmenopausal bleeding.  We have not evaluated her for this, but I do recommend follow up as this can be a sign of multiple different things including cancer of the reproductive tract.

## 2020-05-23 NOTE — Telephone Encounter (Signed)
Attempted to reach patient at number provided x 3. No availability to leave a voicemail.

## 2020-05-24 NOTE — Telephone Encounter (Signed)
Left message to call Tyjay Galindo at [phone]. 

## 2020-05-31 NOTE — Telephone Encounter (Signed)
Left message for pt to return call to triage RN. 

## 2020-06-03 NOTE — Telephone Encounter (Signed)
Dr.Silva, Unable to reach patient. Would you like to proceed with letter?

## 2020-06-04 NOTE — Telephone Encounter (Signed)
Patient does need a letter.  She never presented for care for a report of postmenopausal bleeding.

## 2020-07-10 ENCOUNTER — Ambulatory Visit: Payer: 59 | Admitting: Certified Nurse Midwife

## 2020-12-16 ENCOUNTER — Ambulatory Visit (INDEPENDENT_AMBULATORY_CARE_PROVIDER_SITE_OTHER): Payer: 59 | Admitting: Nurse Practitioner

## 2020-12-16 ENCOUNTER — Encounter: Payer: Self-pay | Admitting: Nurse Practitioner

## 2020-12-16 ENCOUNTER — Other Ambulatory Visit: Payer: Self-pay

## 2020-12-16 VITALS — BP 124/68 | HR 64 | Ht 63.75 in | Wt 138.0 lb

## 2020-12-16 DIAGNOSIS — Z01419 Encounter for gynecological examination (general) (routine) without abnormal findings: Secondary | ICD-10-CM | POA: Diagnosis not present

## 2020-12-16 NOTE — Progress Notes (Signed)
   Tonya Henry Aug 14, 1961 [id number]   History:  60 y.o. G2P0 presents for annual exam without GYN complaints. Postmenopausal - no HRT, no bleeding. Normal pap and mammogram history. Sister diagnosed with breast cancer in her early 17s, survivor. PCP manages HTN, HLD.   Gynecologic History Patient's last menstrual period was 10/06/2007 (exact date).   Contraception/Family planning: post menopausal status  Health Maintenance Last Pap: 06/23/2017. Results were: normal Last mammogram: 01/15/2020. Results were: normal Last colonoscopy: 2018. 5-year recall Last Dexa: Never  Past medical history, past surgical history, family history and social history were all reviewed and documented in the EPIC chart.  ROS:  A ROS was performed and pertinent positives and negatives are included.  Exam:  Vitals:   12/16/20 0933  BP: 124/68  Pulse: 64  Weight: 138 lb (62.6 kg)  Height: 5' 3.75" (1.619 m)   Body mass index is 23.87 kg/m.  General appearance:  Normal Thyroid:  Symmetrical, normal in size, without palpable masses or nodularity. Respiratory  Auscultation:  Clear without wheezing or rhonchi Cardiovascular  Auscultation:  Regular rate, without rubs, murmurs or gallops  Edema/varicosities:  Not grossly evident Abdominal  Soft,nontender, without masses, guarding or rebound.  Liver/spleen:  No organomegaly noted  Hernia:  None appreciated  Skin  Inspection:  Grossly normal   Breasts: Examined lying and sitting.   Right: Without masses, retractions, discharge or axillary adenopathy.   Left: Without masses, retractions, discharge or axillary adenopathy. Gentitourinary   Inguinal/mons:  Normal without inguinal adenopathy  External genitalia:  Normal  BUS/Urethra/Skene's glands:  Normal  Vagina:  Normal  Cervix:  Normal  Uterus:  Normal in size, shape and contour.  Midline and mobile  Adnexa/parametria:     Rt: Without masses or tenderness.   Lt: Without masses or  tenderness.  Anus and perineum: Normal  Digital rectal exam: Normal sphincter tone without palpated masses or tenderness   Assessment/Plan:  60 y.o. G2P0 for annual exam.   Well female exam with routine gynecological exam - Education provided on SBEs, importance of preventative screenings, current guidelines, high calcium diet, regular exercise, and multivitamin daily. Labs with PCP.  Screening for cervical cancer - Normal Pap history. Discussed current guidelines. She would like pap today.   Screening for breast cancer - Normal mammogram history.  Continue annual screenings.  Normal breast exam today.  Screening for colon cancer - 2018 colonoscopy. Will repeat at GI's recommended interval.   Return in 1 year for annual.   Olivia Mackie [facility], 9:41 AM 12/16/2020

## 2020-12-16 NOTE — Addendum Note (Signed)
Addended by: Zenovia Jordan A on: 12/16/2020 10:23 AM   Modules accepted: Orders

## 2020-12-16 NOTE — Patient Instructions (Signed)

## 2020-12-17 LAB — PAP IG W/ RFLX HPV ASCU

## 2021-01-30 ENCOUNTER — Telehealth: Payer: Self-pay | Admitting: Cardiovascular Disease

## 2021-01-30 ENCOUNTER — Other Ambulatory Visit: Payer: Self-pay | Admitting: Nurse Practitioner

## 2021-01-30 DIAGNOSIS — Z1231 Encounter for screening mammogram for malignant neoplasm of breast: Secondary | ICD-10-CM

## 2021-01-30 NOTE — Telephone Encounter (Signed)
New Message:    Pt would like to stay at [facility] and be a pt of Dr Bjorn Pippin, Is this alright with both of you?

## 2021-02-11 ENCOUNTER — Telehealth: Payer: Self-pay | Admitting: Cardiology

## 2021-02-11 NOTE — Telephone Encounter (Signed)
Received a message via pt schedule requesting an appointment 02/17/21 or later with Dr. Cristal Deer for palpitations. The pt has been scheduled for her first available on 03/07/21 and was advised this message was sent for a nurse to contact her. Please advise.

## 2021-02-11 NOTE — Telephone Encounter (Signed)
Called left  Unavailable - left message on voicemail.    at the current time the schedule appointment on 03/07/21 is the first available . Will defer to Dr Cristal Deer and nurse if possible cancellation arise to contact patient  May call back if needed.Marland Kitchen

## 2021-02-26 NOTE — Telephone Encounter (Signed)
Currently no sooner appointment available. Will continue to monitor cancellations.

## 2021-03-06 NOTE — Progress Notes (Signed)
Cardiology Office Note   Date:  03/07/2021   ID:  Tonya Henry, DOB 10-13-1960, MRN [id number]  PCP:  Daisy Floro, MD  Cardiologist: Dr.Christopher  CC: Chest Pressure and Palpitations    History of Present Illness: Tonya Henry is a 60 y.o. female who presents for ongoing assessment and management of palpitations, SVT with history of hypertension, and hyperlipidemia.  ZIO cardiac monitor revealed a maximum heart rate of 207 with an average heart rate of 88.  She did have NSVT but only 7 beats long.  The SVT episodes were worse with caffeine.  Dr. Cristal Deer also noted on that visit that lisinopril was discontinued when GFR level was 48.  Was last seen by Dr. Cristal Deer on 02/02/2020.    She remained on metoprolol which helped to manage her palpitations, blood pressure was at goal.  The patient declined statin therapy.  She was to follow-up in 1 year.  She called our office on 02/11/2021 requesting an appointment as she was feeling that her palpitations have become worse.  She states that they happen almost every day a couple of times a day.  Other days she does not feel them.  But she is feeling them at least 4 to 5 days a week.  She states that she is taking her medications as directed.  She also complains of GERD symptoms after eating spicy foods, lasting several minutes up to 2 hours.  Tums  and Prilosec are not helpful to her.  She has not had an EGD in the past.  She is followed by GI though.  Past Medical History:  Diagnosis Date  . Back pain   . Endometriosis   . High cholesterol   . Hypertension   . Lumbar herniated disc    L4 & 5  . Palpitations   . Radicular syndrome     Past Surgical History:  Procedure Laterality Date  . BREAST REDUCTION SURGERY  2012  . LAPAROSCOPIC ENDOMETRIOSIS FULGURATION  1992  . pinguecula removal  09/2015, 12/2005  . REDUCTION MAMMAPLASTY Bilateral      Current Outpatient Medications  Medication Sig Dispense Refill  . amLODipine  (NORVASC) 5 MG tablet Take 5 mg by mouth daily.    Marland Kitchen ezetimibe (ZETIA) 10 MG tablet Take 10 mg by mouth daily.    . metoprolol succinate (TOPROL-XL) 50 MG 24 hr tablet Take 50 mg by mouth daily.    . pantoprazole (PROTONIX) 40 MG tablet Take 1 tablet (40 mg total) by mouth daily. 30 tablet 11  . rosuvastatin (CRESTOR) 5 MG tablet SMARTSIG:1 Tablet(s) By Mouth 2-3 Times Weekly     No current facility-administered medications for this visit.    Allergies:   Patient has no known allergies.    Social History:  The patient  reports that she has never smoked. She has never used smokeless tobacco. She reports that she does not drink alcohol and does not use drugs.   Family History:  The patient's family history includes Breast cancer in an other family member; Breast cancer (age of onset: 29) in her sister; Diabetes in her mother and another family member; Heart disease in her mother; Hypertension in her mother and another family member; Kidney disease in an other family member.    ROS: All other systems are reviewed and negative. Unless otherwise mentioned in H&P    PHYSICAL EXAM: VS:  BP 124/82   Pulse 73   Ht 5\' 4"  (1.626 m)   Wt 139 lb 9.6 oz (  63.3 kg)   LMP 10/06/2007 (Exact Date)   SpO2 100%   BMI 23.96 kg/m  , BMI Body mass index is 23.96 kg/m. GEN: Well nourished, well developed, in no acute distress HEENT: normal Neck: no JVD, carotid bruits, or masses Cardiac: RRR; occasional extrasystole, no murmurs, rubs, or gallops,no edema  Respiratory:  Clear to auscultation bilaterally, normal work of breathing GI: soft, nontender, nondistended, + BS MS: no deformity or atrophy Skin: warm and dry, no rash Neuro:  Strength and sensation are intact Psych: euthymic mood, full affect   EKG: (Personally reviewed) sinus rhythm with PACs, no evidence of WPW, heart rate of 73 bpm.  Recent Labs: No results found for requested labs within last 8760 hours.    Lipid Panel No results  found for: CHOL, TRIG, HDL, CHOLHDL, VLDL, LDLCALC, LDLDIRECT    Wt Readings from Last 3 Encounters:  03/07/21 139 lb 9.6 oz (63.3 kg)  12/16/20 138 lb (62.6 kg)  02/02/20 137 lb (62.1 kg)      Other studies Reviewed: Monitor 07/31/19 13 days of data recorded on Zio monitor. Patient had a min HR of 58 bpm, max HR of 207 bpm, and avg HR of 88 bpm. Predominant underlying rhythm was Sinus Rhythm. No atrial fibrillation, high degree block, or pauses noted. Isolated ventricular ectopy was rare (<1%). There were 12 triggered events, which were predominantly sinus rhythm with PACs. Monitor labeled one event as NSVT, but appears to be SVT with aberrancy as rate was 207 beats per minute (only 7 beats long). Similar morphology to sinus beats, but QRS slightly wide, consistent with rate related aberrancy. 40 Supraventricular Tachycardia runs occurred, the run with the fastest interval lasting 4 beats with a max rate of 187 bpm, the longest lasting 24.3 secs with an avg rate of 120 bpm. PACs were frequent (5.6%, 91001).  Echo 06/30/19 1. Left ventricular ejection fraction, by visual estimation, is 60 to 65%. The left ventricle has normal function. Normal left ventricular size. There is no left ventricular hypertrophy. 2. Left ventricular diastolic Doppler parameters are consistent with impaired relaxation pattern of LV diastolic filling. 3. Global right ventricle has normal systolic function.The right ventricular size is normal. 4. Left atrial size was normal. 5. Right atrial size was normal. 6. The mitral valve is normal in structure. Trace mitral valve regurgitation. No evidence of mitral stenosis. 7. The tricuspid valve is normal in structure. Tricuspid valve regurgitation is trivial. 8. The aortic valve is normal in structure. Aortic valve regurgitation was not visualized by color flow Doppler. Structurally normal aortic valve, with no evidence of sclerosis or stenosis. 9. The pulmonic valve  was normal in structure. Pulmonic valve regurgitation is not visualized by color flow Doppler. 10. Normal pulmonary artery systolic pressure. 11. The inferior vena cava is normal in size with greater than 50% respiratory variability, suggesting right atrial pressure of 3 mmHg. 12. Normal LV systolic function; grade 1 diastolic dysfunction; mild proximal septal thickening.   ASSESSMENT AND PLAN:  1.  Increased frequency of palpitations: She will continue metoprolol 50 mg daily but I have advised her that she may take an additional 25 mg if she has recurrent palpitations which are lasting on and off all day.  I will go to go ahead and repeat her ZIO cardiac monitor to evaluate for frequency, morphology, and heart rate for comparison to prior heart monitor 2 years ago.  In the interim I will check some labs to include a vitamin D, magnesium, and a  BMET.  She will follow-up with Dr. Cristal Deer for discussion of test results and need for referral to EP at her discretion.  She expresses concern over her palpitations and discomfort as she has several family members with hypertrophic cardiomyopathy.  I reviewed her echocardiogram and given her reassurance that this is not seen on images.  2.  GERD: I will prescribe her Protonix 40 mg daily and have advised her to follow-up with GI for possible EGD.  She verbalizes understanding.  Current medicines are reviewed at length with the patient today.  I have spent 25 minutes dedicated to the care of this patient on the date of this encounter to include pre-visit review of records, assessment, management and diagnostic testing,with shared decision making.  Labs/ tests ordered today include: 2-week ZIO cardiac monitor, BMET, magnesium, vitamin D  Bettey Mare. Liborio Nixon, ANP, AACC   03/07/2021 1:21 PM    [facility] [address] Office [phone] Fax [phone]  Notice: This dictation was prepared with Dragon  dictation along with smaller phrase technology. Any transcriptional errors that result from this process are unintentional and may not be corrected upon review.

## 2021-03-07 ENCOUNTER — Ambulatory Visit (INDEPENDENT_AMBULATORY_CARE_PROVIDER_SITE_OTHER): Payer: 59 | Admitting: Adult Health

## 2021-03-07 ENCOUNTER — Encounter: Payer: Self-pay | Admitting: Adult Health

## 2021-03-07 ENCOUNTER — Encounter: Payer: Self-pay | Admitting: Radiology

## 2021-03-07 ENCOUNTER — Ambulatory Visit: Payer: Self-pay | Admitting: Cardiology

## 2021-03-07 ENCOUNTER — Other Ambulatory Visit: Payer: Self-pay

## 2021-03-07 VITALS — BP 124/82 | HR 73 | Ht 64.0 in | Wt 139.6 lb

## 2021-03-07 DIAGNOSIS — K219 Gastro-esophageal reflux disease without esophagitis: Secondary | ICD-10-CM | POA: Diagnosis not present

## 2021-03-07 DIAGNOSIS — I491 Atrial premature depolarization: Secondary | ICD-10-CM | POA: Diagnosis not present

## 2021-03-07 DIAGNOSIS — R002 Palpitations: Secondary | ICD-10-CM

## 2021-03-07 MED ORDER — PANTOPRAZOLE SODIUM 40 MG PO TBEC: 40.0000 mg | DELAYED_RELEASE_TABLET | Freq: Every day | ORAL | 11 refills | Status: AC

## 2021-03-07 NOTE — Patient Instructions (Signed)
Medication Instructions:  No Changes *If you need a refill on your cardiac medications before your next appointment, please call your pharmacy*   Lab Work: BMP, Magnesium, Vitamin D If you have labs (blood work) drawn today and your tests are completely normal, you will receive your results only by: Marland Kitchen MyChart Message (if you have MyChart) OR . A paper copy in the mail If you have any lab test that is abnormal or we need to change your treatment, we will call you to review the results.   Testing/Procedures: Your physician has recommended that you wear an event monitor. Event monitors are medical devices that record the heart's electrical activity. Doctors most often [place] these monitors to diagnose arrhythmias. Arrhythmias are problems with the speed or rhythm of the heartbeat. The monitor is a small, portable device. You can wear one while you do your normal daily activities. This is usually used to diagnose what is causing palpitations/syncope (passing out).    Follow-Up: At [facility], you and your health needs are our priority.  As part of our continuing mission to provide you with exceptional heart care, we have created designated Provider Care Teams.  These Care Teams include your primary Cardiologist (physician) and Advanced Practice Providers (APPs -  Physician Assistants and Nurse Practitioners) who all work together to provide you with the care you need, when you need it.  Your next appointment:   2 month(s)  The format for your next appointment:   In Person  Provider:   Jodelle Red, MD   Other Instructions For Break Through Palpitations can Take an Additional Metoprolol.

## 2021-03-07 NOTE — Progress Notes (Signed)
Enrolled patient for a 30 ay [employer] event monitor to be mailed to patients home

## 2021-03-08 LAB — BASIC METABOLIC PANEL
BUN/Creatinine Ratio: 10 — ABNORMAL LOW (ref 12–28)
BUN: 11 mg/dL (ref 8–27)
CO2: 25 mmol/L (ref 20–29)
Calcium: 9.6 mg/dL (ref 8.7–10.3)
Chloride: 106 mmol/L (ref 96–106)
Creatinine, Ser: 1.05 mg/dL — ABNORMAL HIGH (ref 0.57–1.00)
Glucose: 84 mg/dL (ref 65–99)
Potassium: 4.2 mmol/L (ref 3.5–5.2)
Sodium: 144 mmol/L (ref 134–144)
eGFR: 61 mL/min/{1.73_m2} (ref >59)

## 2021-03-08 LAB — VITAMIN D 25 HYDROXY (VIT D DEFICIENCY, FRACTURES): Vit D, 25-Hydroxy: 73.1 ng/mL (ref 30.0–100.0)

## 2021-03-08 LAB — MAGNESIUM: Magnesium: 1.8 mg/dL (ref 1.6–2.3)

## 2021-03-24 ENCOUNTER — Other Ambulatory Visit: Payer: Self-pay

## 2021-03-24 ENCOUNTER — Ambulatory Visit
Admission: RE | Admit: 2021-03-24 | Discharge: 2021-03-24 | Disposition: A | Discharge status: Home / Self Care | Payer: 59 | Source: Ambulatory Visit | Attending: Nurse Practitioner | Admitting: Nurse Practitioner

## 2021-03-24 DIAGNOSIS — Z1231 Encounter for screening mammogram for malignant neoplasm of breast: Secondary | ICD-10-CM

## 2021-03-27 ENCOUNTER — Ambulatory Visit (INDEPENDENT_AMBULATORY_CARE_PROVIDER_SITE_OTHER): Payer: 59

## 2021-03-27 DIAGNOSIS — R002 Palpitations: Secondary | ICD-10-CM | POA: Diagnosis not present

## 2021-03-29 ENCOUNTER — Encounter: Payer: Self-pay | Admitting: Cardiology

## 2021-03-31 ENCOUNTER — Telehealth: Payer: Self-pay

## 2021-03-31 NOTE — Telephone Encounter (Signed)
Received a critical monitor from [employer] showing Afib RVR with a rate of 160. Report reviewed by Dr. [place] (DOD) who recommended pt schedule a sooner appointment.   Attempted to contact pt. Left message to call back.

## 2021-04-02 NOTE — Telephone Encounter (Signed)
Left message to call back  

## 2021-04-02 NOTE — Telephone Encounter (Signed)
Spoke to pt and scheduled an appointment for 7/12 with Gillian Shields, NP

## 2021-04-14 NOTE — Progress Notes (Signed)
Office Visit    Patient Name: Tonya Henry Date of Encounter: 04/15/2021  PCP:  Daisy Floro, MD   [facility]  Cardiologist:  Jodelle Red, MD  Advanced Practice Provider:  No care team member to display Electrophysiologist:  None    Chief Complaint    Tonya Henry is a 60 y.o. female with a hx of palpitations, SVT, atrial fibrillation, hypertension, hyperlipidemia presents today for atrial fibrillation   Past Medical History    Past Medical History:  Diagnosis Date   Back pain    Endometriosis    High cholesterol    Hypertension    Lumbar herniated disc    L4 & 5   Palpitations    Radicular syndrome    Past Surgical History:  Procedure Laterality Date   BREAST REDUCTION SURGERY  2012   LAPAROSCOPIC ENDOMETRIOSIS FULGURATION  1992   pinguecula removal  09/2015, 12/2005   REDUCTION MAMMAPLASTY Bilateral     Allergies  No Known Allergies  History of Present Illness    Tonya Henry is a 60 y.o. female with a hx of palpitations, SVT, atrial fibrillation, hypertension, hyperlipidemia last seen 03/07/21.   Echo 06/30/19 LVEF 60-65%, nor RWMA, trace MR. She wore previous monitor 07/2019 showing maximum heart to 7 bpm, average heart rate 88 bpm with isolated episode of NSVT 7 beats long.  Noted previous SVT episodes were worse with caffeine.  She was managed on metoprolol.  She declined statin therapy.  She was seen in follow-up 03/07/21 by Joni Reining, NP.  She noted worsening palpitations as well as symptoms of GERD.  14-day monitor placed.  BMP, magnesium, vitamin D collected.  She was started on Protonix 20 mg daily for GERD and recommended to follow-up with GI.  We received an event monitor alert from [employer] 03/31/2021 showing atrial fibrillation RVR with rate of 160 bpm.  She presents today for follow-up. Reports no shortness of breath nor dyspnea on exertion. Reports no chest pain, pressure, or tightness. No edema, orthopnea,  PND.  Reports palpitations are intermittent. Tells me the Protonix has not made much of a difference in her symptoms which were attributed to acid reflux but she only took for 3 days. She has not had any chest pain or indigestion. We did discuss that a two week trial of Protonix is recommended.   We reviewed her preliminary preventice report showing atrial fibrillation. Reports no family history of atrial fibrillation. No alcohol use nor excessive caffeine use. Recent TSH, BMP unremarkable. We reviewed pathophysiology of atrial fibrillation, stroke risk, and management of atrial fibrillation.  EKGs/Labs/Other Studies Reviewed:   The following studies were reviewed today: Monitor 07/31/19 13 days of data recorded on Zio monitor. Patient had a min HR of 58 bpm, max HR of 207 bpm, and avg HR of 88 bpm. Predominant underlying rhythm was Sinus Rhythm. No atrial fibrillation, high degree block, or pauses noted. Isolated ventricular ectopy was rare (<1%). There were 12 triggered events, which were predominantly sinus rhythm with PACs. Monitor labeled one event as NSVT, but appears to be SVT with aberrancy as rate was 207 beats per minute (only 7 beats long). Similar morphology to sinus beats, but QRS slightly wide, consistent with rate related aberrancy. 40 Supraventricular Tachycardia runs occurred, the run with the fastest interval lasting 4 beats with a max rate of 187 bpm, the longest lasting 24.3 secs with an avg rate of 120 bpm. PACs were frequent (5.6%, 91001).   Echo 06/30/19  1. Left ventricular ejection fraction, by visual estimation, is 60 to 65%. The left ventricle has normal function. Normal left ventricular size. There is no left ventricular hypertrophy.  2. Left ventricular diastolic Doppler parameters are consistent with impaired relaxation pattern of LV diastolic filling.  3. Global right ventricle has normal systolic function.The right ventricular size is normal.  4. Left atrial size was  normal.  5. Right atrial size was normal.  6. The mitral valve is normal in structure. Trace mitral valve regurgitation. No evidence of mitral stenosis.  7. The tricuspid valve is normal in structure. Tricuspid valve regurgitation is trivial.  8. The aortic valve is normal in structure. Aortic valve regurgitation was not visualized by color flow Doppler. Structurally normal aortic valve, with no evidence of sclerosis or stenosis.  9. The pulmonic valve was normal in structure. Pulmonic valve regurgitation is not visualized by color flow Doppler. 10. Normal pulmonary artery systolic pressure. 11. The inferior vena cava is normal in size with greater than 50% respiratory variability, suggesting right atrial pressure of 3 mmHg. 12. Normal LV systolic function; grade 1 diastolic dysfunction; mild proximal septal thickening.      EKG:  No EKG today  Recent Labs: 03/07/2021: BUN 11; Creatinine, Ser 1.05; Magnesium 1.8; Potassium 4.2; Sodium 144  Recent Lipid Panel No results found for: CHOL, TRIG, HDL, CHOLHDL, VLDL, LDLCALC, LDLDIRECT  Risk Assessment/Calculations:   CHA2DS2-VASc Score = 2  This indicates a 2.2% annual risk of stroke. The patient's score is based upon: CHF History: No HTN History: Yes Diabetes History: No Stroke History: No Vascular Disease History: No Age Score: 0 Gender Score: 1     Home Medications   Current Meds  Medication Sig   amLODipine (NORVASC) 5 MG tablet Take 5 mg by mouth daily.   ezetimibe (ZETIA) 10 MG tablet Take 10 mg by mouth daily.   metoprolol succinate (TOPROL-XL) 50 MG 24 hr tablet Take 50 mg by mouth daily.   pantoprazole (PROTONIX) 40 MG tablet Take 1 tablet (40 mg total) by mouth daily.   rosuvastatin (CRESTOR) 5 MG tablet SMARTSIG:1 Tablet(s) By Mouth 2-3 Times Weekly     Review of Systems      All other systems reviewed and are otherwise negative except as noted above.  Physical Exam    VS:  BP 108/62   Pulse 80   Ht 5\' 4"   (1.626 m)   Wt 143 lb 3.2 oz (65 kg)   LMP 10/06/2007 (Exact Date)   SpO2 98%   BMI 24.58 kg/m  , BMI Body mass index is 24.58 kg/m.  Wt Readings from Last 3 Encounters:  04/15/21 143 lb 3.2 oz (65 kg)  03/07/21 139 lb 9.6 oz (63.3 kg)  12/16/20 138 lb (62.6 kg)     GEN: Well nourished, well developed, in no acute distress. HEENT: normal. Neck: Supple, no JVD, carotid bruits, or masses. Cardiac: RRR, no murmurs, rubs, or gallops. No clubbing, cyanosis, edema.  Radials/PT 2+ and equal bilaterally.  Respiratory:  Respirations regular and unlabored, clear to auscultation bilaterally. GI: Soft, nontender, nondistended. MS: No deformity or atrophy. Skin: Warm and dry, no rash. Neuro:  Strength and sensation are intact. Psych: Normal affect.  Assessment & Plan    PAF / Chronic anticoagulation / NSVT / Palpitations - [employer] monitor with new onset atrial fibrillation. CHA2DS2-VASc Score = 2 [CHF History: No, HTN History: Yes, Diabetes History: No, Stroke History: No, Vascular Disease History: No, Age Score: 0, Gender Score: 1].  Therefore, the patient's annual risk of stroke is 2.2 %.  Increase Toprol to 75mg  QD. If this does not adequately control rhythm consider antiarrhythmic agent.  Start Eliuqis 5mg  BID. CBC ordered as last collected >1 year ago. One week sample, 30-day free coupon, and co-pay card provided. Update echocardiogram to rule out valvular abnormality. OSA not discussed today, consider sleep study at follow up.  HLD - Continue Zetia 10mg  QD, Crestor 5mg  QD.  GERD - Continue Prootnix 40mg  daily. Further refills per PCP.   HTN - BP well controlled. Continue current antihypertensive regimen.    Disposition: Follow up  05/12/21 as sheculed  with Dr. or APP.  Signed, , NP 04/15/2021, 8:34 AM [facility]

## 2021-04-15 ENCOUNTER — Encounter (HOSPITAL_BASED_OUTPATIENT_CLINIC_OR_DEPARTMENT_OTHER): Payer: Self-pay | Admitting: Family

## 2021-04-15 ENCOUNTER — Ambulatory Visit (INDEPENDENT_AMBULATORY_CARE_PROVIDER_SITE_OTHER): Payer: 59 | Admitting: Family

## 2021-04-15 ENCOUNTER — Other Ambulatory Visit: Payer: Self-pay

## 2021-04-15 VITALS — BP 108/62 | HR 80 | Ht 64.0 in | Wt 143.2 lb

## 2021-04-15 DIAGNOSIS — I10 Essential (primary) hypertension: Secondary | ICD-10-CM

## 2021-04-15 DIAGNOSIS — E782 Mixed hyperlipidemia: Secondary | ICD-10-CM | POA: Diagnosis not present

## 2021-04-15 DIAGNOSIS — Z7901 Long term (current) use of anticoagulants: Secondary | ICD-10-CM | POA: Diagnosis not present

## 2021-04-15 DIAGNOSIS — I480 Paroxysmal atrial fibrillation: Secondary | ICD-10-CM | POA: Diagnosis not present

## 2021-04-15 MED ORDER — METOPROLOL SUCCINATE ER 50 MG PO TB24: ORAL_TABLET | ORAL | 1 refills | Status: DC

## 2021-04-15 MED ORDER — APIXABAN 5 MG PO TABS: 5.0000 mg | ORAL_TABLET | Freq: Two times a day (BID) | ORAL | 1 refills | Status: DC

## 2021-04-15 MED ORDER — METOPROLOL SUCCINATE ER 25 MG PO TB24: ORAL_TABLET | ORAL | 1 refills | Status: DC

## 2021-04-15 NOTE — Patient Instructions (Addendum)
Medication Instructions:  Your physician has recommended you make the following change in your medication:   START Eliquis 5mg  twice daily  CHANGE Metoprolol to 1.5 tablets per day for a dose of 75mg    *If you need a refill on your cardiac medications before your next appointment, please call your pharmacy*   Lab Work: Your physician recommends lab work: CBC either today or tomorrow.   If you have labs (blood work) drawn today and your tests are completely normal, you will receive your results only by: MyChart Message (if you have MyChart) OR A paper copy in the mail If you have any lab test that is abnormal or we need to change your treatment, we will call you to review the results.   Testing/Procedures: Your physician has requested that you have an echocardiogram Friday, July 29 @ 7:20 am. Echocardiography is a painless test that uses sound waves to create images of your heart. It provides your doctor with information about the size and shape of your heart and how well your heart's chambers and valves are working. This procedure takes approximately one hour. There are no restrictions for this procedure.    Follow-Up: At [facility], you and your health needs are our priority.  As part of our continuing mission to provide you with exceptional heart care, we have created designated Provider Care Teams.  These Care Teams include your primary Cardiologist (physician) and Advanced Practice Providers (APPs -  Physician Assistants and Nurse Practitioners) who all work together to provide you with the care you need, when you need it.  We recommend signing up for the patient portal called "MyChart".  Sign up information is provided on this After Visit Summary.  MyChart is used to connect with patients for Virtual Visits (Telemedicine).  Patients are able to view lab/test results, encounter notes, upcoming appointments, etc.  Non-urgent messages can be sent to your provider as well.   To learn  more about what you can do with MyChart, go to July 31.    Your next appointment:   August 8th as scheduled   Other Instructions  Atrial Fibrillation  Atrial fibrillation is a type of heartbeat that is irregular or fast. If you have this condition, your heart beats without any order. This makes it hard foryour heart to pump blood in a normal way. Atrial fibrillation may come and go, or it may become a long-lasting problem. If this condition is not treated, it can put you at higher risk for stroke,heart failure, and other heart problems. What are the causes? This condition may be caused by diseases that damage the heart. They include: High blood pressure. Heart failure. Heart valve disease. Heart surgery. Other causes include: Diabetes. Thyroid disease. Being overweight. Kidney disease. Sometimes the cause is not known. What increases the risk? You are more likely to develop this condition if: You are older. You smoke. You exercise often and very hard. You have a family history of this condition. You are a man. You use drugs. You drink a lot of alcohol. You have lung conditions, such as emphysema, pneumonia, or COPD. You have sleep apnea. What are the signs or symptoms? Common symptoms of this condition include: A feeling that your heart is beating very fast. Chest pain or discomfort. Feeling short of breath. Suddenly feeling light-headed or weak. Getting tired easily during activity. Fainting. Sweating. In some cases, there are no symptoms. How is this treated? Treatment for this condition depends on underlying conditions and how you feel  when you have atrial fibrillation. They include: Medicines to: Prevent blood clots. Treat heart rate or heart rhythm problems. Using devices, such as a pacemaker, to correct heart rhythm problems. Doing surgery to remove the part of the heart that sends bad signals. Closing an area where clots can form in the heart  (left atrial appendage). In some cases, your doctor will treat other underlying conditions. Follow these instructions at home: Medicines Take over-the-counter and prescription medicines only as told by your doctor. Do not take any new medicines without first talking to your doctor. If you are taking blood thinners: Talk with your doctor before you take any medicines that have aspirin or NSAIDs, such as ibuprofen, in them. Take your medicine exactly as told by your doctor. Take it at the same time each day. Avoid activities that could hurt or bruise you. Follow instructions about how to prevent falls. Wear a bracelet that says you are taking blood thinners. Or, carry a card that lists what medicines you take. Lifestyle     Do not use any products that have nicotine or tobacco in them. These include cigarettes, e-cigarettes, and chewing tobacco. If you need help quitting, ask your doctor. Eat heart-healthy foods. Talk with your doctor about the right eating plan for you. Exercise regularly as told by your doctor. Do not drink alcohol. Lose weight if you are overweight. Do not use drugs, including cannabis. General instructions If you have a condition that causes breathing to stop for a short period of time (apnea), treat it as told by your doctor. Keep a healthy weight. Do not use diet pills unless your doctor says they are safe for you. Diet pills may make heart problems worse. Keep all follow-up visits as told by your doctor. This is important. Contact a doctor if: You notice a change in the speed, rhythm, or strength of your heartbeat. You are taking a blood-thinning medicine and you get more bruising. You get tired more easily when you move or exercise. You have a sudden change in weight. Get help right away if:  You have pain in your chest or your belly (abdomen). You have trouble breathing. You have side effects of blood thinners, such as blood in your vomit, poop (stool), or  pee (urine), or bleeding that cannot stop. You have any signs of a stroke. "BE FAST" is an easy way to remember the main warning signs: B - Balance. Signs are dizziness, sudden trouble walking, or loss of balance. E - Eyes. Signs are trouble seeing or a change in how you see. F - Face. Signs are sudden weakness or loss of feeling in the face, or the face or eyelid drooping on one side. A - Arms. Signs are weakness or loss of feeling in an arm. This happens suddenly and usually on one side of the body. S - Speech. Signs are sudden trouble speaking, slurred speech, or trouble understanding what people say. T - Time. Time to call emergency services. Write down what time symptoms started. You have other signs of a stroke, such as: A sudden, very bad headache with no known cause. Feeling like you may vomit (nausea). Vomiting. A seizure. These symptoms may be an emergency. Do not wait to see if the symptoms will go away. Get medical help right away. Call your local emergency services (911 in the [place] Do not drive yourself to the hospital. Summary Atrial fibrillation is a type of heartbeat that is irregular or fast. You are at higher risk  of this condition if you smoke, are older, have diabetes, or are overweight. Follow your doctor's instructions about medicines, diet, exercise, and follow-up visits. Get help right away if you have signs or symptoms of a stroke. Get help right away if you cannot catch your breath, or you have chest pain or discomfort. This information is not intended to replace advice given to you by your health care provider. Make sure you discuss any questions you have with your healthcare provider. Document Revised: 03/15/2019 Document Reviewed: 03/15/2019 Elsevier Patient Education  2022 [employer].

## 2021-04-22 ENCOUNTER — Encounter (HOSPITAL_BASED_OUTPATIENT_CLINIC_OR_DEPARTMENT_OTHER): Payer: Self-pay | Admitting: Adult Health

## 2021-04-24 ENCOUNTER — Encounter (HOSPITAL_BASED_OUTPATIENT_CLINIC_OR_DEPARTMENT_OTHER): Payer: Self-pay

## 2021-05-02 ENCOUNTER — Ambulatory Visit (HOSPITAL_COMMUNITY): Payer: 59 | Attending: Cardiovascular Disease

## 2021-05-02 ENCOUNTER — Other Ambulatory Visit: Payer: Self-pay

## 2021-05-02 DIAGNOSIS — I10 Essential (primary) hypertension: Secondary | ICD-10-CM | POA: Diagnosis not present

## 2021-05-02 DIAGNOSIS — Z7901 Long term (current) use of anticoagulants: Secondary | ICD-10-CM | POA: Diagnosis not present

## 2021-05-02 DIAGNOSIS — I480 Paroxysmal atrial fibrillation: Secondary | ICD-10-CM | POA: Insufficient documentation

## 2021-05-02 DIAGNOSIS — E782 Mixed hyperlipidemia: Secondary | ICD-10-CM | POA: Diagnosis not present

## 2021-05-02 LAB — ECHOCARDIOGRAM COMPLETE
Area-P 1/2: 4.21 cm2
S' Lateral: 2.2 cm

## 2021-05-12 ENCOUNTER — Ambulatory Visit (INDEPENDENT_AMBULATORY_CARE_PROVIDER_SITE_OTHER): Payer: 59 | Admitting: Cardiology

## 2021-05-12 ENCOUNTER — Encounter (HOSPITAL_BASED_OUTPATIENT_CLINIC_OR_DEPARTMENT_OTHER): Payer: Self-pay | Admitting: Cardiology

## 2021-05-12 ENCOUNTER — Other Ambulatory Visit: Payer: Self-pay

## 2021-05-12 VITALS — BP 118/68 | HR 74 | Ht 64.0 in | Wt 140.0 lb

## 2021-05-12 DIAGNOSIS — Z7901 Long term (current) use of anticoagulants: Secondary | ICD-10-CM

## 2021-05-12 DIAGNOSIS — I480 Paroxysmal atrial fibrillation: Secondary | ICD-10-CM

## 2021-05-12 DIAGNOSIS — Z712 Person consulting for explanation of examination or test findings: Secondary | ICD-10-CM | POA: Diagnosis not present

## 2021-05-12 DIAGNOSIS — D6869 Other thrombophilia: Secondary | ICD-10-CM

## 2021-05-12 DIAGNOSIS — I10 Essential (primary) hypertension: Secondary | ICD-10-CM

## 2021-05-12 NOTE — Assessment & Plan Note (Deleted)
CHA2DS2/VAS Stroke Risk Points  Current as of 17 minutes ago     2 >= 2 Points: High Risk  1 - 1.99 Points: Medium Risk  0 Points: Low Risk    No Change      Details    This score determines the patient's risk of having a stroke if the  patient has atrial fibrillation.       Points Metrics  0 Has Congestive Heart Failure:  No    Current as of 17 minutes ago  0 Has Vascular Disease:  No    Current as of 17 minutes ago  1 Has Hypertension:  Yes    Current as of 17 minutes ago  0 Age:  60    Current as of 17 minutes ago  0 Has Diabetes:  No    Current as of 17 minutes ago  0 Had Stroke:  No  Had TIA:  No  Had Thromboembolism:  No    Current as of 17 minutes ago  1 Female:  Yes    Current as of 17 minutes ago

## 2021-05-12 NOTE — Progress Notes (Signed)
Cardiology Office Note:    Date:  05/12/2021   ID:  Tonya Henry, DOB Feb 05, 1961, MRN [id number]  PCP:  Tonya Floro, MD  Cardiologist:  Tonya Red, MD PhD  Referring MD: Tonya Floro, MD   CC: follow up  History of Present Illness:    Tonya Henry is a 60 y.o. female with a hx of paroxysmal atrial fibrillation (on monitor), HTN, HLD who is seen for follow up. I initially saw her 06/22/19 as a new consult at the request of Tonya Floro, MD for the evaluation and management of palpitations.  Today: Overall she is feeling good. There have been no recent episodes of racing heart beats or palpitations. She has not needed to take an extra dose of 25 mg metoprolol. We discussed her recent monitor results showing 6 min of Atrial fibrillation.   Since beginning Eliquis, she is bruising very easily. She asks if she can use a lower dose due to the bruising. Otherwise she is tolerating her medication.  She denies any chest pain, or shortness of breath. No lightheadedness, headaches, syncope, orthopnea, or PND. Also has no lower extremity edema or exertional symptoms.   Past Medical History:  Diagnosis Date   Back pain    Endometriosis    High cholesterol    Hypertension    Lumbar herniated disc    L4 & 5   Palpitations    Radicular syndrome     Past Surgical History:  Procedure Laterality Date   BREAST REDUCTION SURGERY  2012   LAPAROSCOPIC ENDOMETRIOSIS FULGURATION  1992   pinguecula removal  09/2015, 12/2005   REDUCTION MAMMAPLASTY Bilateral     Current Medications: Current Outpatient Medications on File Prior to Visit  Medication Sig   amLODipine (NORVASC) 5 MG tablet Take 5 mg by mouth daily.   apixaban (ELIQUIS) 5 MG TABS tablet Take 1 tablet (5 mg total) by mouth 2 (two) times daily.   ezetimibe (ZETIA) 10 MG tablet Take 10 mg by mouth daily.   metoprolol succinate (TOPROL XL) 25 MG 24 hr tablet Take one 25mg  tablet with one 50mg  tablet once  daily for total dose of 75mg  daily. (Patient taking differently: Take 25 mg by mouth as needed.)   metoprolol succinate (TOPROL-XL) 50 MG 24 hr tablet Take one 25mg  tablet with one 50mg  tablet once daily for total dose of 75mg  daily. (Patient taking differently: Take 50 mg by mouth daily.)   pantoprazole (PROTONIX) 40 MG tablet Take 1 tablet (40 mg total) by mouth daily.   rosuvastatin (CRESTOR) 5 MG tablet SMARTSIG:1 Tablet(s) By Mouth 2-3 Times Weekly   No current facility-administered medications on file prior to visit.     Allergies:   Patient has no known allergies.   Social History   Tobacco Use   Smoking status: Never   Smokeless tobacco: Never  Vaping Use   Vaping Use: Never used  Substance Use Topics   Alcohol use: No    Alcohol/week: 0.0 standard drinks   Drug use: No    Family History: The patient's family history includes Breast cancer in an other family member; Breast cancer (age of onset: 16) in her sister; Diabetes in her mother and another family member; Heart disease in her mother; Hypertension in her mother and another family member; Kidney disease in an other family member.  ROS:   Please see the history of present illness.   (+) Easy bruising Additional pertinent ROS otherwise unremarkable.   EKGs/Labs/Other Studies Reviewed:  The following studies were reviewed today:  Echo 05/02/2021:  1. Left ventricular ejection fraction, by estimation, is 60 to 65%. The  left ventricle has normal function. The left ventricle has no regional  wall motion abnormalities. There is mild left ventricular hypertrophy.  Left ventricular diastolic parameters  were normal.   2. Right ventricular systolic function is normal. The right ventricular  size is normal.   3. The mitral valve is normal in structure. Mild mitral valve  regurgitation. No evidence of mitral stenosis.   4. The aortic valve is tricuspid. Aortic valve regurgitation is not  visualized. No aortic  stenosis is present.   5. The inferior vena cava is normal in size with greater than 50%  respiratory variability, suggesting right atrial pressure of 3 mmHg.  Monitor 04/29/2021: 30 days of data recorded on [employer] monitor. Patient had a min HR of 55 bpm, max HR of 183 bpm, and avg HR of 87 bpm. Predominant underlying rhythm was Sinus Rhythm. No VT, high degree block, or pauses noted. There was a total of 6 min of atrial fibrillation detected over 30 days. Isolated ventricular ectopy was rare (<1%). Isolated atrial ectopy was occasional (9%).  Monitor 07/31/19 13 days of data recorded on Zio monitor. Patient had a min HR of 58 bpm, max HR of 207 bpm, and avg HR of 88 bpm. Predominant underlying rhythm was Sinus Rhythm. No atrial fibrillation, high degree block, or pauses noted. Isolated ventricular ectopy was rare (<1%). There were 12 triggered events, which were predominantly sinus rhythm with PACs. Monitor labeled one event as NSVT, but appears to be SVT with aberrancy as rate was 207 beats per minute (only 7 beats long). Similar morphology to sinus beats, but QRS slightly wide, consistent with rate related aberrancy. 40 Supraventricular Tachycardia runs occurred, the run with the fastest interval lasting 4 beats with a max rate of 187 bpm, the longest lasting 24.3 secs with an avg rate of 120 bpm. PACs were frequent (5.6%, 91001).   Echo 06/30/19  1. Left ventricular ejection fraction, by visual estimation, is 60 to 65%. The left ventricle has normal function. Normal left ventricular size. There is no left ventricular hypertrophy.  2. Left ventricular diastolic Doppler parameters are consistent with impaired relaxation pattern of LV diastolic filling.  3. Global right ventricle has normal systolic function.The right ventricular size is normal.  4. Left atrial size was normal.  5. Right atrial size was normal.  6. The mitral valve is normal in structure. Trace mitral valve regurgitation. No  evidence of mitral stenosis.  7. The tricuspid valve is normal in structure. Tricuspid valve regurgitation is trivial.  8. The aortic valve is normal in structure. Aortic valve regurgitation was not visualized by color flow Doppler. Structurally normal aortic valve, with no evidence of sclerosis or stenosis.  9. The pulmonic valve was normal in structure. Pulmonic valve regurgitation is not visualized by color flow Doppler. 10. Normal pulmonary artery systolic pressure. 11. The inferior vena cava is normal in size with greater than 50% respiratory variability, suggesting right atrial pressure of 3 mmHg. 12. Normal LV systolic function; grade 1 diastolic dysfunction; mild proximal septal thickening.  EKG:  EKG is personally reviewed.   05/12/2021: EKG is not ordered today.  06/22/19: SR with PACs  Recent Labs: 03/07/2021: BUN 11; Creatinine, Ser 1.05; Magnesium 1.8; Potassium 4.2; Sodium 144  Recent Lipid Panel No results found for: CHOL, TRIG, HDL, CHOLHDL, VLDL, LDLCALC, LDLDIRECT  Physical Exam:  VS:  BP 118/68   Pulse 74   Ht 5\' 4"  (1.626 m)   Wt 140 lb (63.5 kg)   LMP 10/06/2007 (Exact Date)   BMI 24.03 kg/m     Wt Readings from Last 3 Encounters:  05/12/21 140 lb (63.5 kg)  04/15/21 143 lb 3.2 oz (65 kg)  03/07/21 139 lb 9.6 oz (63.3 kg)    GEN: Well nourished, well developed in no acute distress HEENT: Normal, moist mucous membranes NECK: No JVD CARDIAC: regular rhythm, normal S1 and S2, no rubs or gallops. 1/6 SE murmur. VASCULAR: Radial and DP pulses 2+ bilaterally. No carotid bruits RESPIRATORY:  Clear to auscultation without rales, wheezing or rhonchi  ABDOMEN: Soft, non-tender, non-distended MUSCULOSKELETAL:  Ambulates independently SKIN: Warm and dry, no edema NEUROLOGIC:  Alert and oriented x 3. No focal neuro deficits noted. PSYCHIATRIC:  Normal affect   ASSESSMENT:    1. Paroxysmal atrial fibrillation (HCC)   2. Encounter to discuss test results   3.  Secondary hypercoagulable state (HCC)   4. Long term current use of anticoagulant   5. Essential hypertension     PLAN:    Palpitations Paroxysmal SVT Paroxysmal atrial fibrillation, with secondary hypercoagulable state -echo unremarkable -initial Zio with pSVT, most recent monitor with brief atrial fibrillation -CHADSVASC=2. We discussed what we know and don't know about stroke risk with brief/incidental atrial fibrillation. She does bruise easily but has not had issues with bleeding -after shared decision making, will continue apixaban for long term anticoagulation  Hypertension: at goal today -continue amlodipine, lisinopril, metoprolol  Hypercholesterolemia: see prior notes, declines statin  Cardiac risk counseling and prevention recommendations: -recommend heart healthy/Mediterranean diet, with whole grains, fruits, vegetable, fish, lean meats, nuts, and olive oil. Limit salt. -recommend moderate walking, 3-5 times/week for 30-50 minutes each session. Aim for at least 150 minutes.week. Goal should be pace of 3 miles/hours, or walking 1.5 miles in 30 minutes -recommend avoidance of tobacco products. Avoid excess alcohol.  Plan for follow up: 1 year or sooner as needed  Medication Adjustments/Labs and Tests Ordered: Current medicines are reviewed at length with the patient today.  Concerns regarding medicines are outlined above.   No orders of the defined types were placed in this encounter.  No orders of the defined types were placed in this encounter.  Patient Instructions  Medication Instructions:  Your Physician recommend you continue on your current medication as directed.    *If you need a refill on your cardiac medications before your next appointment, please call your pharmacy*   Lab Work: None ordered today   Testing/Procedures: None ordered today   Follow-Up: At [facility], you and your health needs are our priority.  As part of our continuing  mission to provide you with exceptional heart care, we have created designated Provider Care Teams.  These Care Teams include your primary Cardiologist (physician) and Advanced Practice Providers (APPs -  Physician Assistants and Nurse Practitioners) who all work together to provide you with the care you need, when you need it.  We recommend signing up for the patient portal called "MyChart".  Sign up information is provided on this After Visit Summary.  MyChart is used to connect with patients for Virtual Visits (Telemedicine).  Patients are able to view lab/test results, encounter notes, upcoming appointments, etc.  Non-urgent messages can be sent to your provider as well.   To learn more about what you can do with MyChart, go to [facility].    Your next  appointment:   1 year(s)  The format for your next appointment:   In Person  Provider:   Jodelle Red, MD     [facility],acting as a scribe for Tonya Red, MD.,have documented all relevant documentation on the behalf of Tonya Red, MD,as directed by  Tonya Red, MD while in the presence of Tonya Red, MD.  I, Tonya Red, MD, have reviewed all documentation for this visit. The documentation on 05/12/21 for the exam, diagnosis, procedures, and orders are all accurate and complete.   Signed, Tonya Red, MD PhD 05/12/2021  [facility]

## 2021-05-12 NOTE — Patient Instructions (Signed)

## 2021-06-21 IMAGING — MG DIGITAL DIAGNOSTIC UNILATERAL RIGHT MAMMOGRAM WITH TOMO AND CAD
6 series · 6 of 18 positions shown · non-contrast
Comparison: Previous exam(s).

CLINICAL DATA: 57-year-old presenting with a possible palpable lump
in the OUTER RIGHT breast on recent clinical examination, associated
with tenderness to palpation.

Personal history of BILATERAL reduction mammoplasty. Family history
of breast cancer in her sister at age 52.
EXAM:
DIGITAL DIAGNOSTIC RIGHT MAMMOGRAM WITH CAD AND TOMO
ULTRASOUND RIGHT BREAST

[R MLO synth-2D]
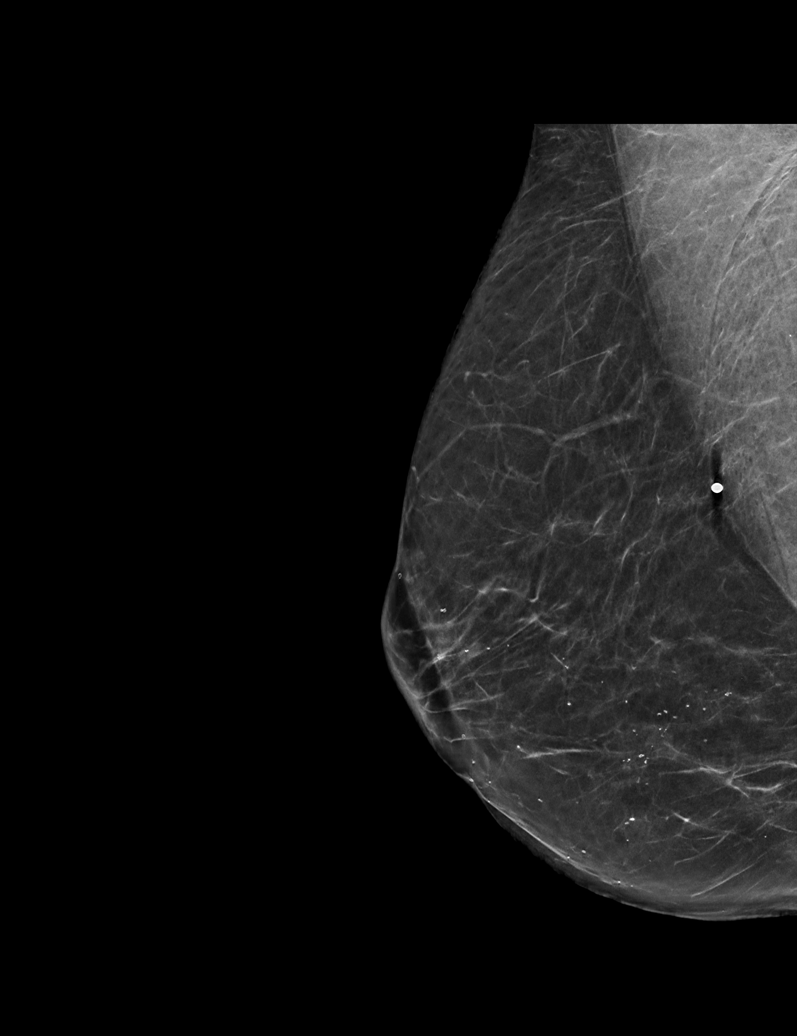

[R XCCL synth-2D]
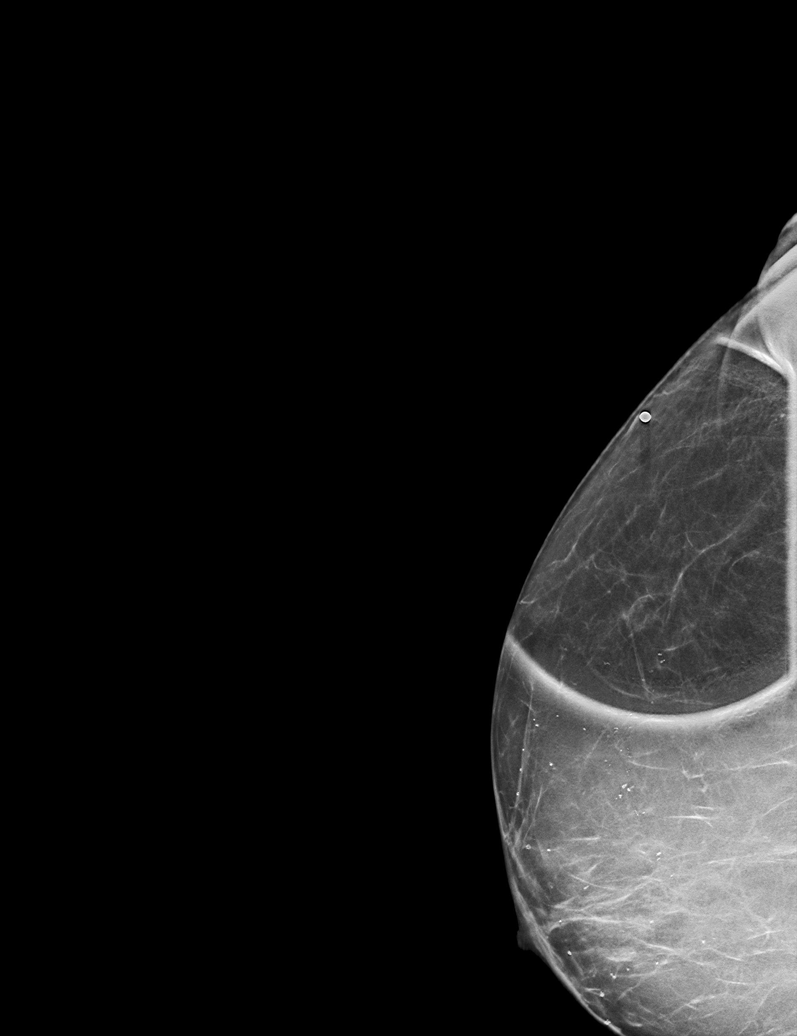

[R CC synth-2D]
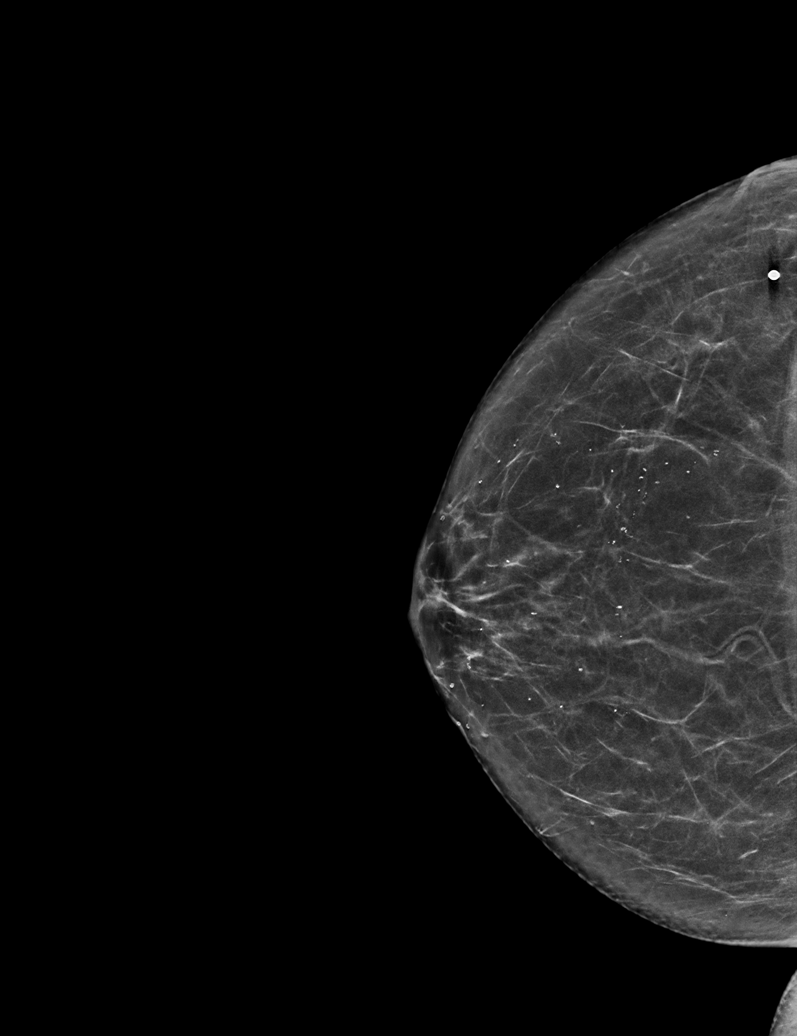

[R CC tomo · tomo slice 32/63.0]
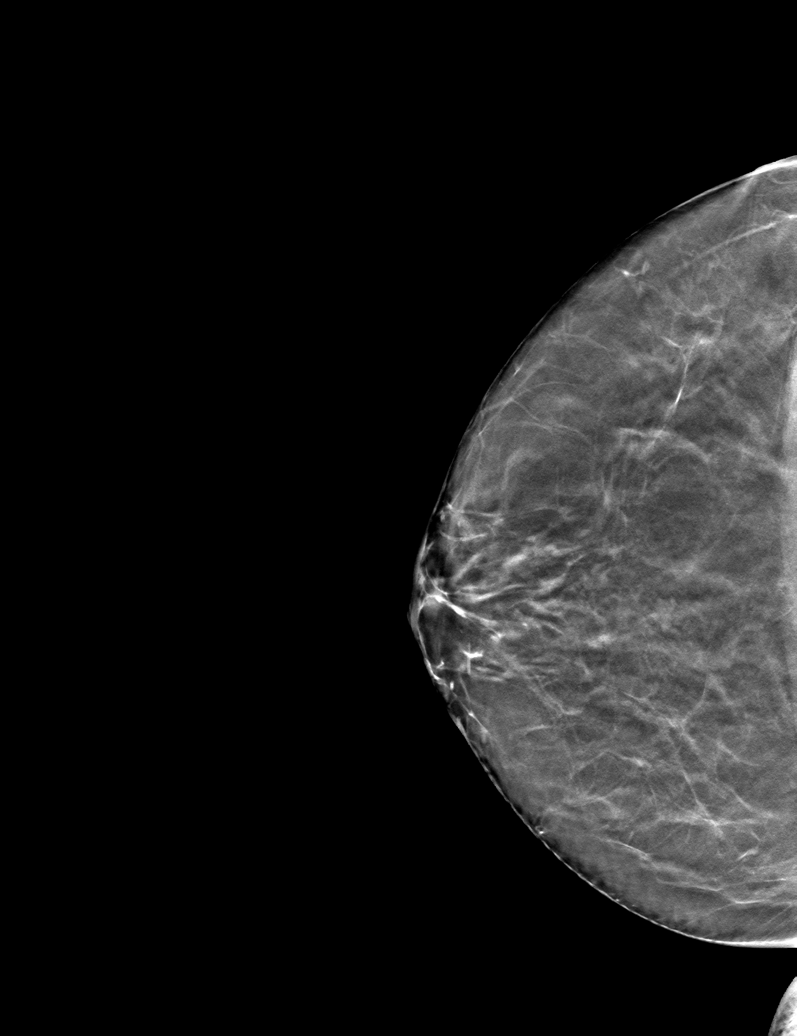

[R MLO tomo · tomo slice 32/63.0]
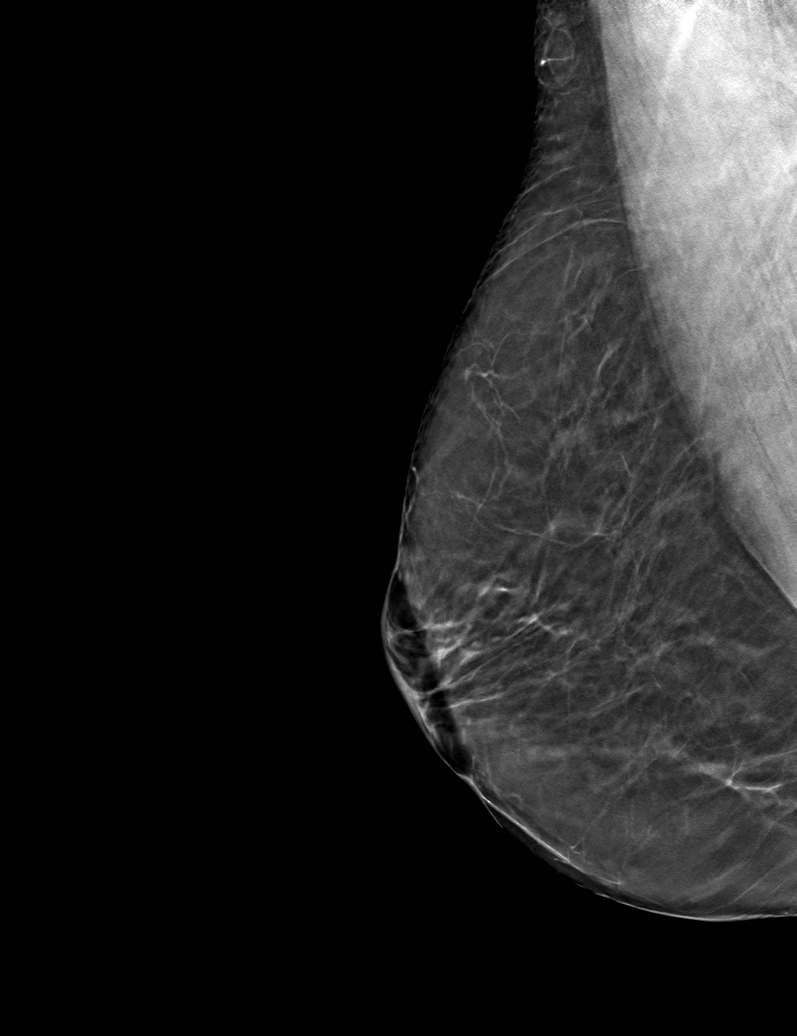

[R XCCL tomo · tomo slice 27/53.0]
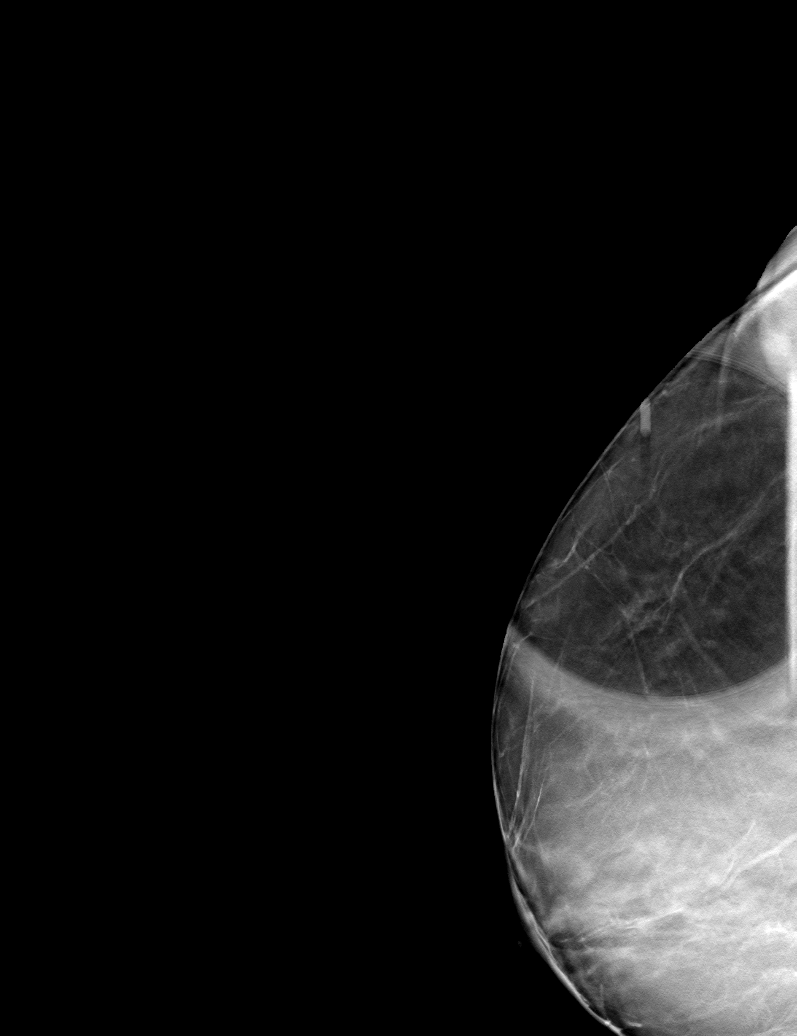

[6 of 18 positions shown; findings below may reference images not displayed]

ACR Breast Density Category b: There are scattered areas of
fibroglandular density.
FINDINGS: Tomosynthesis and synthesized full field CC and MLO views of the
RIGHT breast were obtained. Tomosynthesis and synthesized spot
compression tangential view of the area of concern in the RIGHT
breast was also obtained.

No mammographic abnormality in the area of palpable concern in the
OUTER breast at POSTERIOR depth. Mild architectural distortion
related to scarring from the prior reduction mammoplasty is present
deep at this location, unchanged dating back to the 1766 mammogram.
No new or suspicious findings in the RIGHT breast.

Mammographic images were processed with CAD.

On correlative physical exam, there is a soft palpable ridge of
tissue in the OUTER RIGHT breast at POSTERIOR depth in the area of
palpable concern, though I do not palpate a discrete mass.

Targeted RIGHT breast ultrasound is performed, showing normal
fibrofatty tissue in the OUTER breast at POSTERIOR depth at the 8
o'clock position approximately 8 cm from the nipple in the area of
palpable concern. There is a prominent subcutaneous fat lobule at
this location which may account for what is being felt. No cyst,
solid mass or abnormal acoustic shadowing is identified.
IMPRESSION: No mammographic or sonographic evidence of malignancy involving the
RIGHT breast.

RECOMMENDATION:
Annual BILATERAL screening mammography which is due in November 2019.

I have discussed the findings and recommendations with the patient.
If applicable, a reminder letter will be sent to the patient
regarding the next appointment.

BI-RADS CATEGORY  2: Benign.

## 2021-06-21 IMAGING — US ULTRASOUND RIGHT BREAST LIMITED
1 series · 2 of 2 positions shown · non-contrast
Comparison: Previous exam(s).

CLINICAL DATA: 57-year-old presenting with a possible palpable lump
in the OUTER RIGHT breast on recent clinical examination, associated
with tenderness to palpation.

Personal history of BILATERAL reduction mammoplasty. Family history
of breast cancer in her sister at age 52.
EXAM:
DIGITAL DIAGNOSTIC RIGHT MAMMOGRAM WITH CAD AND TOMO
ULTRASOUND RIGHT BREAST

[Series 1: ultrasound right breast limited · 0.06mm/px · 2 of 2 slices shown]
[im 1/2]
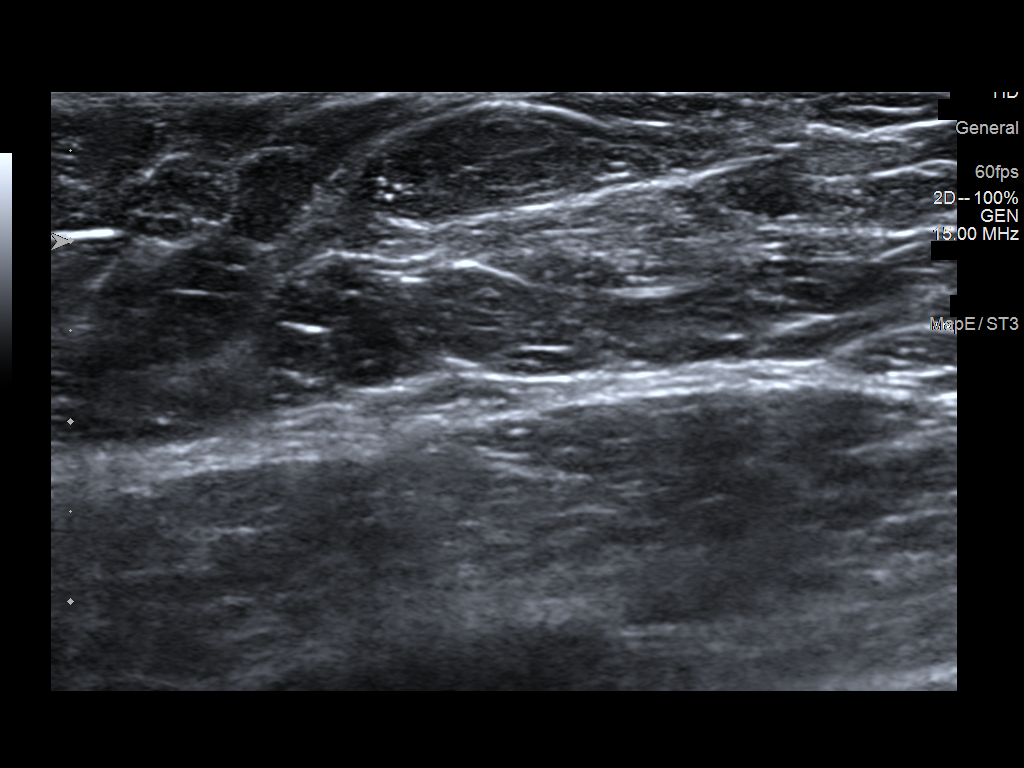
[im 2/2]
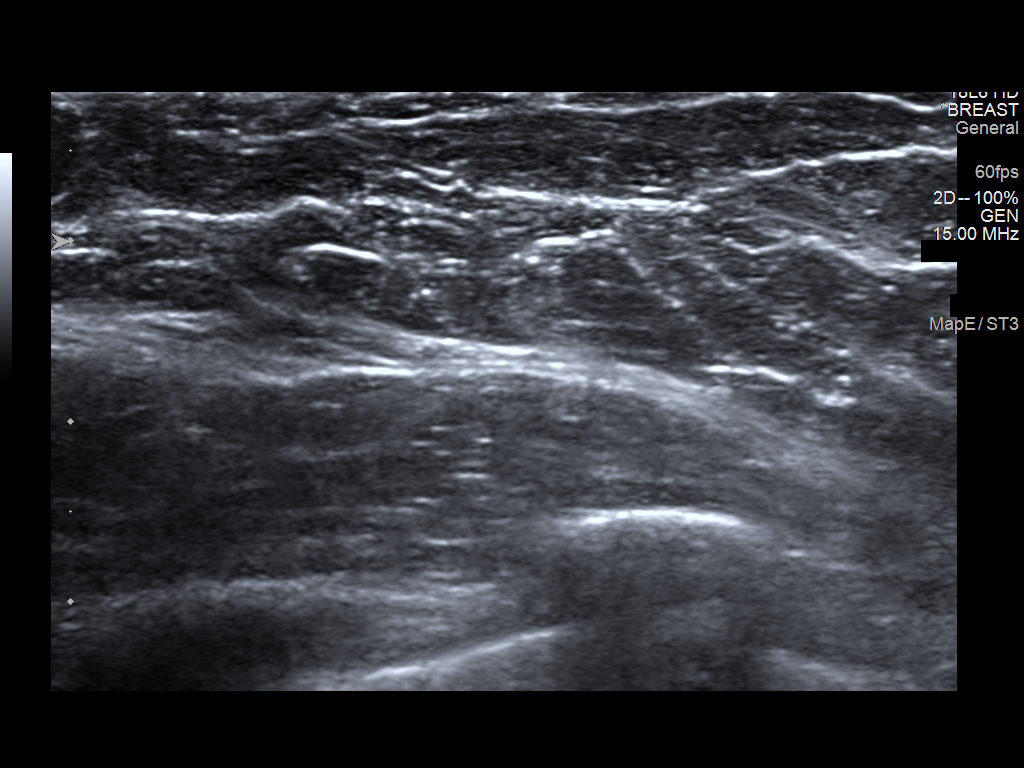

[2 of 2 positions shown; findings below may reference images not displayed]

ACR Breast Density Category b: There are scattered areas of
fibroglandular density.
FINDINGS: Tomosynthesis and synthesized full field CC and MLO views of the
RIGHT breast were obtained. Tomosynthesis and synthesized spot
compression tangential view of the area of concern in the RIGHT
breast was also obtained.

No mammographic abnormality in the area of palpable concern in the
OUTER breast at POSTERIOR depth. Mild architectural distortion
related to scarring from the prior reduction mammoplasty is present
deep at this location, unchanged dating back to the 1766 mammogram.
No new or suspicious findings in the RIGHT breast.

Mammographic images were processed with CAD.

On correlative physical exam, there is a soft palpable ridge of
tissue in the OUTER RIGHT breast at POSTERIOR depth in the area of
palpable concern, though I do not palpate a discrete mass.

Targeted RIGHT breast ultrasound is performed, showing normal
fibrofatty tissue in the OUTER breast at POSTERIOR depth at the 8
o'clock position approximately 8 cm from the nipple in the area of
palpable concern. There is a prominent subcutaneous fat lobule at
this location which may account for what is being felt. No cyst,
solid mass or abnormal acoustic shadowing is identified.
IMPRESSION: No mammographic or sonographic evidence of malignancy involving the
RIGHT breast.

RECOMMENDATION:
Annual BILATERAL screening mammography which is due in November 2019.

I have discussed the findings and recommendations with the patient.
If applicable, a reminder letter will be sent to the patient
regarding the next appointment.

BI-RADS CATEGORY  2: Benign.

## 2021-07-18 ENCOUNTER — Other Ambulatory Visit (HOSPITAL_BASED_OUTPATIENT_CLINIC_OR_DEPARTMENT_OTHER): Payer: Self-pay | Admitting: Family

## 2021-11-26 ENCOUNTER — Telehealth: Payer: Self-pay | Admitting: Cardiology

## 2021-11-26 NOTE — Telephone Encounter (Signed)
See below concerning Eliquis. Thank you!

## 2021-11-26 NOTE — Telephone Encounter (Signed)
Pa sent to plan. Will route back to taylor williams to make her aware.  Christian Burnett Sheng (Key: [id number]) Eliquis 5MG  tablets Status: Sent To Plan Created: February 22nd, 2023 Sent: February 22nd, 2023

## 2021-11-26 NOTE — Telephone Encounter (Signed)
Patient needs a Prior Authorization for her Eliquis to be sent to [facility]

## 2021-11-26 NOTE — Telephone Encounter (Signed)
PA for Eliquis

## 2021-11-27 NOTE — Telephone Encounter (Signed)
Thank you :)

## 2021-11-28 ENCOUNTER — Encounter (HOSPITAL_BASED_OUTPATIENT_CLINIC_OR_DEPARTMENT_OTHER): Payer: Self-pay

## 2021-12-02 ENCOUNTER — Encounter (HOSPITAL_BASED_OUTPATIENT_CLINIC_OR_DEPARTMENT_OTHER): Payer: Self-pay

## 2021-12-02 NOTE — Telephone Encounter (Signed)
Has this been approved?

## 2021-12-02 NOTE — Telephone Encounter (Signed)
No determination on my cmm page they may faxed the paper and not receive it yet but as far from what I can tell no determination as of yet

## 2021-12-02 NOTE — Telephone Encounter (Signed)
PA STARTED FOR ELIQUIS 5MG  BID SENT ON CMM: Tonya Henry (Tonya Henry) - [id number] Eliquis 5MG  tablets Status: PA Request Created: February 28th, 2023 Sent: February 28th, 2023

## 2021-12-02 NOTE — Telephone Encounter (Signed)
Updated insurance info to retry PA for Eliquis

## 2021-12-02 NOTE — Telephone Encounter (Signed)
Hey here is patient insurance information for PA?

## 2021-12-02 NOTE — Telephone Encounter (Signed)
Awesome! Thank you!

## 2021-12-02 NOTE — Telephone Encounter (Signed)
ROUTING TO [name] TO NOTIFY OF APPROVAL  Tonya Henry (Tonya Henry) - [id number] Eliquis 5MG  tablets Status: PA Response - Approved Created: February 28th, 2023 Sent: February 28th, 2023

## 2021-12-17 ENCOUNTER — Ambulatory Visit (HOSPITAL_BASED_OUTPATIENT_CLINIC_OR_DEPARTMENT_OTHER): Payer: Self-pay | Admitting: Family

## 2022-01-14 NOTE — Progress Notes (Signed)
? ?Office Visit  ?  ?Patient Name: Tonya Henry ?Date of Encounter: 01/15/2022 ? ?PCP:  Lawerance Cruel, MD ?  ?[place]  ?Cardiologist:  Buford Dresser, MD  ?Advanced Practice Provider:  No care team member to display ?Electrophysiologist:  None  ? ? ?Chief Complaint  ?  ?Tonya Henry is a 61 y.o. female with a hx of palpitations, SVT, atrial fibrillation, hypertension, hyperlipidemia presents today for pounding heart beat in her ears ? ?Past Medical History  ?  ?Past Medical History:  ?Diagnosis Date  ? Back pain   ? Endometriosis   ? High cholesterol   ? Hypertension   ? Lumbar herniated disc   ? L4 & 5  ? Palpitations   ? Radicular syndrome   ? ?Past Surgical History:  ?Procedure Laterality Date  ? BREAST REDUCTION SURGERY  2012  ? LAPAROSCOPIC ENDOMETRIOSIS FULGURATION  1992  ? pinguecula removal  09/2015, 12/2005  ? REDUCTION MAMMAPLASTY Bilateral   ? ? ?Allergies ? ?No Known Allergies ? ?History of Present Illness  ?  ?Tonya Henry is a 61 y.o. female with a hx of palpitations, SVT, atrial fibrillation, hypertension, hyperlipidemia last seen 05/12/21 ? ?Echo 06/30/19 LVEF 60-65%, nor RWMA, trace MR. She wore previous monitor 07/2019 showing maximum heart to 7 bpm, average heart rate 88 bpm with isolated episode of NSVT 7 beats long.  Noted previous SVT episodes were worse with caffeine.  She was managed on metoprolol.  She declined statin therapy. ? ?She was seen in follow-up 03/07/21 by Jory Sims, NP.  She noted worsening palpitations as well as symptoms of GERD.  14-day monitor placed.  BMP, magnesium, vitamin D collected.  She was started on Protonix 20 mg daily for GERD and recommended to follow-up with GI.  We received an event monitor alert from preventive 03/31/2021 showing atrial fibrillation RVR with rate of 160 bpm. ? ?Clinic visit 04/2021 with intermittent palpitations and monitor showing atrial fibrillation reviewed. Eliquis 5mg  BID initiated and Metoprolol  increased to 75mg . Echo ordered and performed 05/02/21 LVEF 60-65%, no RWMA, mild LVH, normal diastolic parameters, mild MR. She was seen 05/2021 by Dr. Harrell Gave doing well and no changes were made. ? ?She presents today for follow up.  Transiently since last seen she had 1 episode of chest pain that occurred after eating and resolved with antacid tablet.  This occurred while sitting still.  We discussed that this is atypical for angina.  She participates in Zumba classes regularly without chest discomfort or exertional dyspnea.  She reports occasional palpitations though much improved since metoprolol increased to 75 mg daily.  She does note that she has been hearing her heartbeat in her left ear.  Was evaluated by ENT and they told her it was related to her heart.  This sometimes disturbs her sleep.  Reports no  near-syncope, syncope but notes no occasional self resolving lightheadedness.  No amaurosis fugax. ? ?EKGs/Labs/Other Studies Reviewed:  ? ?The following studies were reviewed today: ? ?Echo 04/2021 ? ? 1. Left ventricular ejection fraction, by estimation, is 60 to 65%. The  ?left ventricle has normal function. The left ventricle has no regional  ?wall motion abnormalities. There is mild left ventricular hypertrophy.  ?Left ventricular diastolic parameters  ?were normal.  ? 2. Right ventricular systolic function is normal. The right ventricular  ?size is normal.  ? 3. The mitral valve is normal in structure. Mild mitral valve  ?regurgitation. No evidence of mitral stenosis.  ?  4. The aortic valve is tricuspid. Aortic valve regurgitation is not  ?visualized. No aortic stenosis is present.  ? 5. The inferior vena cava is normal in size with greater than 50%  ?respiratory variability, suggesting right atrial pressure of 3 mmHg.  ? ?Monitor 07/31/19 ?13 days of data recorded on Zio monitor. Patient had a min HR of 58 bpm, max HR of 207 bpm, and avg HR of 88 bpm. Predominant underlying rhythm was Sinus Rhythm.  No atrial fibrillation, high degree block, or pauses noted. Isolated ventricular ectopy was rare (<1%). There were 12 triggered events, which were predominantly sinus rhythm with PACs. Monitor labeled one event as NSVT, but appears to be SVT with aberrancy as rate was 207 beats per minute (only 7 beats long). Similar morphology to sinus beats, but QRS slightly wide, consistent with rate related aberrancy. 40 Supraventricular Tachycardia runs occurred, the run with the fastest interval lasting 4 beats with a max rate of 187 bpm, the longest lasting 24.3 secs with an avg rate of 120 bpm. PACs were frequent (5.6%, 91001). ?  ?Echo 06/30/19 ? 1. Left ventricular ejection fraction, by visual estimation, is 60 to 65%. The left ventricle has normal function. Normal left ventricular size. There is no left ventricular hypertrophy. ? 2. Left ventricular diastolic Doppler parameters are consistent with impaired relaxation pattern of LV diastolic filling. ? 3. Global right ventricle has normal systolic function.The right ventricular size is normal. ? 4. Left atrial size was normal. ? 5. Right atrial size was normal. ? 6. The mitral valve is normal in structure. Trace mitral valve regurgitation. No evidence of mitral stenosis. ? 7. The tricuspid valve is normal in structure. Tricuspid valve regurgitation is trivial. ? 8. The aortic valve is normal in structure. Aortic valve regurgitation was not visualized by color flow Doppler. Structurally normal aortic valve, with no evidence of sclerosis or stenosis. ? 9. The pulmonic valve was normal in structure. Pulmonic valve regurgitation is not visualized by color flow Doppler. ?10. Normal pulmonary artery systolic pressure. ?11. The inferior vena cava is normal in size with greater than 50% respiratory variability, suggesting right atrial pressure of 3 mmHg. ?12. Normal LV systolic function; grade 1 diastolic dysfunction; mild proximal septal thickening. ?  ?  ? ?EKG:  EKG ordered  today. EKG performed today demonstrates NSR 69 bpm with no acute St/T wave changes.  ? ?Recent Labs: ?03/07/2021: BUN 11; Creatinine, Ser 1.05; Magnesium 1.8; Potassium 4.2; Sodium 144  ?Recent Lipid Panel ?No results found for: CHOL, TRIG, HDL, CHOLHDL, VLDL, LDLCALC, LDLDIRECT ? ?Risk Assessment/Calculations:  ? ?CHA2DS2-VASc Score = 2  ?This indicates a 2.2% annual risk of stroke. ?The patient's score is based upon: ?CHF History: 0 ?HTN History: 1 ?Diabetes History: 0 ?Stroke History: 0 ?Vascular Disease History: 0 ?Age Score: 0 ?Gender Score: 1 ?  ? ?Home Medications  ? ?Current Meds  ?Medication Sig  ? amLODipine (NORVASC) 5 MG tablet Take 5 mg by mouth daily.  ? ezetimibe (ZETIA) 10 MG tablet Take 10 mg by mouth daily.  ? gabapentin (NEURONTIN) 300 MG capsule Take 300-600 mg by mouth as needed.  ? LINZESS 145 MCG CAPS capsule Take 145 mcg by mouth every morning.  ? losartan (COZAAR) 50 MG tablet Take 50 mg by mouth daily.  ? metoprolol succinate (TOPROL XL) 25 MG 24 hr tablet Take one 25mg  tablet with one 50mg  tablet once daily for total dose of 75mg  daily. (Patient taking differently: Take 25 mg by mouth as  needed.)  ? metoprolol succinate (TOPROL-XL) 50 MG 24 hr tablet TAKE 1 TABLET BY MOUTH DAILY  ? pantoprazole (PROTONIX) 40 MG tablet Take 1 tablet (40 mg total) by mouth daily.  ? rosuvastatin (CRESTOR) 5 MG tablet SMARTSIG:1 Tablet(s) By Mouth 2-3 Times Weekly  ? [DISCONTINUED] apixaban (ELIQUIS) 5 MG TABS tablet Take 1 tablet (5 mg total) by mouth 2 (two) times daily.  ?  ? ?Review of Systems  ?    ?All other systems reviewed and are otherwise negative except as noted above. ? ?Physical Exam  ?  ?VS:  BP 118/70 (BP Location: Left Arm, Patient Position: Sitting, Cuff Size: Normal)   Pulse 69   Ht 5\' 4"  (1.626 m)   Wt 138 lb 14.4 oz (63 kg)   LMP 10/06/2007 (Exact Date)   BMI 23.84 kg/m?  , BMI Body mass index is 23.84 kg/m?. ? ?Wt Readings from Last 3 Encounters:  ?01/15/22 138 lb 14.4 oz (63 kg)   ?05/12/21 140 lb (63.5 kg)  ?04/15/21 143 lb 3.2 oz (65 kg)  ?  ?GEN: Well nourished, well developed, in no acute distress. ?HEENT: normal. ?Neck: Supple, no JVD, carotid bruits, or masses. ?Cardiac: RRR, no murmurs, ru

## 2022-01-15 ENCOUNTER — Ambulatory Visit (HOSPITAL_BASED_OUTPATIENT_CLINIC_OR_DEPARTMENT_OTHER): Payer: 59 | Admitting: Family

## 2022-01-15 ENCOUNTER — Encounter (HOSPITAL_BASED_OUTPATIENT_CLINIC_OR_DEPARTMENT_OTHER): Payer: Self-pay | Admitting: Family

## 2022-01-15 VITALS — BP 118/70 | HR 69 | Ht 64.0 in | Wt 138.9 lb

## 2022-01-15 DIAGNOSIS — E782 Mixed hyperlipidemia: Secondary | ICD-10-CM

## 2022-01-15 DIAGNOSIS — I480 Paroxysmal atrial fibrillation: Secondary | ICD-10-CM

## 2022-01-15 DIAGNOSIS — R42 Dizziness and giddiness: Secondary | ICD-10-CM | POA: Diagnosis not present

## 2022-01-15 DIAGNOSIS — D6859 Other primary thrombophilia: Secondary | ICD-10-CM

## 2022-01-15 DIAGNOSIS — I10 Essential (primary) hypertension: Secondary | ICD-10-CM

## 2022-01-15 MED ORDER — APIXABAN 5 MG PO TABS: 5.0000 mg | ORAL_TABLET | Freq: Two times a day (BID) | ORAL | 1 refills | Status: DC

## 2022-01-15 NOTE — Patient Instructions (Signed)
Medication Instructions:  ?Continue your current medications.  ? ?*If you need a refill on your cardiac medications before your next appointment, please call your pharmacy* ? ?Lab Work: ?None ordered today  ? ?Testing/Procedures: ?Your EKG today shows normal sinus rhythm.  ? ?Your physician has requested that you have a carotid duplex. This test is an ultrasound of the carotid arteries in your neck. It looks at blood flow through these arteries that supply the brain with blood. Allow one hour for this exam. There are no restrictions or special instructions.  ? ?Follow-Up: ?At [facility], you and your health needs are our priority.  As part of our continuing mission to provide you with exceptional heart care, we have created designated Provider Care Teams.  These Care Teams include your primary Cardiologist (physician) and Advanced Practice Providers (APPs -  Physician Assistants and Nurse Practitioners) who all work together to provide you with the care you need, when you need it. ? ?We recommend signing up for the patient portal called "MyChart".  Sign up information is provided on this After Visit Summary.  MyChart is used to connect with patients for Virtual Visits (Telemedicine).  Patients are able to view lab/test results, encounter notes, upcoming appointments, etc.  Non-urgent messages can be sent to your provider as well.   ?To learn more about what you can do with MyChart, go to [email or web address].   ? ?Your next appointment:   ?6 month(s) ? ?The format for your next appointment:   ?In Person ? ?Provider:   ?Jodelle Red, MD  ? ? ?Other Instructions ? ?To prevent palpitations: ?Make sure you are adequately hydrated.  ?Avoid and/or limit caffeine containing beverages like soda or tea. ?Exercise regularly.  ?Manage stress well. ?Some over the counter medications can cause palpitations such as Benadryl, AdvilPM, TylenolPM. Regular Advil or Tylenol do not cause palpitations.   ? ?Important  Information About Sugar ? ? ? ? ?  ?

## 2022-01-16 ENCOUNTER — Encounter (HOSPITAL_BASED_OUTPATIENT_CLINIC_OR_DEPARTMENT_OTHER): Payer: Self-pay

## 2022-01-16 ENCOUNTER — Telehealth (HOSPITAL_BASED_OUTPATIENT_CLINIC_OR_DEPARTMENT_OTHER): Payer: Self-pay | Admitting: Family

## 2022-01-16 NOTE — Telephone Encounter (Signed)
Called pt yesterday 01/15/2022 to schedule carotid, 2x attempts. Tried calling pt today 01/16/2022 to schedule, no answer.  ?

## 2022-01-16 NOTE — Addendum Note (Signed)
Addended by: Alver Sorrow on: 01/16/2022 07:45 AM ? ? Modules accepted: Orders ? ?

## 2022-02-05 ENCOUNTER — Ambulatory Visit (HOSPITAL_COMMUNITY)
Admission: RE | Admit: 2022-02-05 | Discharge: 2022-02-05 | Disposition: A | Discharge status: Home / Self Care | Payer: 59 | Source: Ambulatory Visit | Attending: Cardiovascular Disease | Admitting: Cardiovascular Disease

## 2022-02-05 DIAGNOSIS — R42 Dizziness and giddiness: Secondary | ICD-10-CM | POA: Diagnosis not present

## 2022-02-06 ENCOUNTER — Telehealth (HOSPITAL_BASED_OUTPATIENT_CLINIC_OR_DEPARTMENT_OTHER): Payer: Self-pay

## 2022-02-06 NOTE — Telephone Encounter (Signed)
Reminder set to order Carotid Duplex for repeat in two years ?

## 2022-02-06 NOTE — Telephone Encounter (Signed)
-----   Message from Caitlin S Walker, NP sent at 02/06/2022  9:51 AM EDT ----- ?Carotid duplex with only mild stenosis. Not significant enough to cause lightheadedness. Continue cholesterol medication to prevent progression. Recommend repeat carotid duplex in 2 years for monitoring.  ?

## 2022-02-06 NOTE — Telephone Encounter (Addendum)
Results called to patient who verbalizes understanding!  ? ? ?----- Message from Alver Sorrow, NP sent at 02/06/2022  9:51 AM EDT ----- ?Carotid duplex with only mild stenosis. Not significant enough to cause lightheadedness. Continue cholesterol medication to prevent progression. Recommend repeat carotid duplex in 2 years for monitoring.  ?

## 2022-02-06 NOTE — Telephone Encounter (Signed)
-----   Message from Loel Dubonnet, NP sent at 02/06/2022  9:51 AM EDT ----- ?Carotid duplex with only mild stenosis. Not significant enough to cause lightheadedness. Continue cholesterol medication to prevent progression. Recommend repeat carotid duplex in 2 years for monitoring.  ?

## 2022-04-02 ENCOUNTER — Other Ambulatory Visit: Payer: Self-pay | Admitting: Nurse Practitioner

## 2022-04-02 DIAGNOSIS — Z1231 Encounter for screening mammogram for malignant neoplasm of breast: Secondary | ICD-10-CM

## 2022-04-30 ENCOUNTER — Ambulatory Visit: Payer: 59

## 2022-05-07 ENCOUNTER — Ambulatory Visit
Admission: RE | Admit: 2022-05-07 | Discharge: 2022-05-07 | Disposition: A | Discharge status: Home / Self Care | Payer: 59 | Source: Ambulatory Visit | Attending: Nurse Practitioner | Admitting: Nurse Practitioner

## 2022-05-07 DIAGNOSIS — Z1231 Encounter for screening mammogram for malignant neoplasm of breast: Secondary | ICD-10-CM

## 2022-06-18 ENCOUNTER — Encounter (HOSPITAL_BASED_OUTPATIENT_CLINIC_OR_DEPARTMENT_OTHER): Payer: Self-pay

## 2022-06-18 ENCOUNTER — Other Ambulatory Visit: Payer: Self-pay

## 2022-06-18 ENCOUNTER — Emergency Department (HOSPITAL_BASED_OUTPATIENT_CLINIC_OR_DEPARTMENT_OTHER): Payer: 59

## 2022-06-18 ENCOUNTER — Emergency Department (HOSPITAL_BASED_OUTPATIENT_CLINIC_OR_DEPARTMENT_OTHER)
Admission: EM | Admit: 2022-06-18 | Discharge: 2022-06-18 | Disposition: A | Discharge status: Home / Self Care | Payer: 59 | Attending: Emergency Medicine | Admitting: Emergency Medicine

## 2022-06-18 DIAGNOSIS — R0789 Other chest pain: Secondary | ICD-10-CM | POA: Insufficient documentation

## 2022-06-18 DIAGNOSIS — Z7901 Long term (current) use of anticoagulants: Secondary | ICD-10-CM | POA: Diagnosis not present

## 2022-06-18 DIAGNOSIS — R0602 Shortness of breath: Secondary | ICD-10-CM | POA: Diagnosis not present

## 2022-06-18 DIAGNOSIS — R079 Chest pain, unspecified: Secondary | ICD-10-CM

## 2022-06-18 LAB — BASIC METABOLIC PANEL
Anion gap: 10 (ref 5–15)
BUN: 19 mg/dL (ref 8–23)
CO2: 26 mmol/L (ref 22–32)
Calcium: 10.1 mg/dL (ref 8.9–10.3)
Chloride: 102 mmol/L (ref 98–111)
Creatinine, Ser: 1.23 mg/dL — ABNORMAL HIGH (ref 0.44–1.00)
GFR, Estimated: 50 mL/min — ABNORMAL LOW (ref >60)
Glucose, Bld: 88 mg/dL (ref 70–99)
Potassium: 3.7 mmol/L (ref 3.5–5.1)
Sodium: 138 mmol/L (ref 135–145)

## 2022-06-18 LAB — CBC
HCT: 40.6 % (ref 36.0–46.0)
Hemoglobin: 13.9 g/dL (ref 12.0–15.0)
MCH: 31 pg (ref 26.0–34.0)
MCHC: 34.2 g/dL (ref 30.0–36.0)
MCV: 90.6 fL (ref 80.0–100.0)
Platelets: 289 10*3/uL (ref 150–400)
RBC: 4.48 MIL/uL (ref 3.87–5.11)
RDW: 13.2 % (ref 11.5–15.5)
WBC: 6.7 10*3/uL (ref 4.0–10.5)
nRBC: 0 % (ref 0.0–0.2)

## 2022-06-18 LAB — TROPONIN I (HIGH SENSITIVITY): Troponin I (High Sensitivity): 6 ng/L (ref <18)

## 2022-06-18 NOTE — ED Triage Notes (Signed)
Patient here POV from Home.  Endorses Mid CP that began approximately 12 Hours ago. Eased off Somewhat but is still present this AM. Dull in [place].   No SOB. No Radiation of Pain. No Fevers. No N/V/D.   NAD Noted during Triage. A&Ox4. GCS. 15 Ambulatory.

## 2022-06-18 NOTE — ED Provider Notes (Signed)
[facility] EMERGENCY DEPT Provider Note   CSN: [id number] Arrival date & time: 06/18/22  1610     History  Chief Complaint  Patient presents with   Chest Pain    Tonya Henry is a 61 y.o. female.   Chest Pain Patient resents with chest pain.  Anterior chest.  Began last night.  Dull.  Improved today.  Last around 2 hours yesterday.  Still having a little pain now however.  Not exertional.  States she has had pain like this in the past but is gone away.  History of atrial fibrillation and does not know when she is in A-fib.  States she has potentially felt a little more short of breath last few days.     Home Medications Prior to Admission medications   Medication Sig Start Date End Date Taking? Authorizing Provider  amLODipine (NORVASC) 5 MG tablet Take 5 mg by mouth daily.    [provider]  apixaban (ELIQUIS) 5 MG TABS tablet Take 1 tablet (5 mg total) by mouth 2 (two) times daily. 01/15/22   Alver Sorrow, NP  ezetimibe (ZETIA) 10 MG tablet Take 10 mg by mouth daily. 03/04/21   [provider]  gabapentin (NEURONTIN) 300 MG capsule Take 300-600 mg by mouth as needed. 11/30/21   [provider]  LINZESS 145 MCG CAPS capsule Take 145 mcg by mouth every morning. 12/06/21   [provider]  losartan (COZAAR) 50 MG tablet Take 50 mg by mouth daily. 12/17/21   [provider]  metoprolol succinate (TOPROL XL) 25 MG 24 hr tablet Take one 25mg  tablet with one 50mg  tablet once daily for total dose of 75mg  daily. Patient taking differently: Take 25 mg by mouth as needed. 04/15/21   , NP  metoprolol succinate (TOPROL-XL) 50 MG 24 hr tablet TAKE 1 TABLET BY MOUTH DAILY 07/18/21   06/16/21, MD  pantoprazole (PROTONIX) 40 MG tablet Take 1 tablet (40 mg total) by mouth daily. 03/07/21   07/20/21, NP  rosuvastatin (CRESTOR) 5 MG tablet SMARTSIG:1 Tablet(s) By Mouth 2-3 Times Weekly 11/23/20    [provider]      Allergies    Patient has no known allergies.    Review of Systems   Review of Systems  Cardiovascular:  Positive for chest pain.    Physical Exam Updated Vital Signs BP 126/67   Pulse 77   Temp 98.4 F (36.9 C) (Oral)   Resp (!) 27   Ht 5\' 4"  (1.626 m)   Wt 61.2 kg   LMP 10/06/2007 (Exact Date)   SpO2 100%   BMI 23.17 kg/m  Physical Exam Vitals reviewed.  Cardiovascular:     Rate and Rhythm: Normal rate. Rhythm irregular.  Pulmonary:     Effort: Pulmonary effort is normal.  Chest:     Chest wall: No tenderness.  Abdominal:     Tenderness: There is no abdominal tenderness.  Musculoskeletal:     Cervical back: Neck supple.     Right lower leg: No edema.     Left lower leg: No edema.  Neurological:     Mental Status: She is alert.     ED Results / Procedures / Treatments   Labs (all labs ordered are listed, but only abnormal results are displayed) Labs Reviewed  BASIC METABOLIC PANEL - Abnormal; Notable for the following components:      Result Value   Creatinine, Ser 1.23 (*)    GFR,  Estimated 50 (*)    All other components within normal limits  CBC  TROPONIN I (HIGH SENSITIVITY)    EKG EKG Interpretation  Date/Time:  Thursday June 18 2022 16:17:04 EDT Ventricular Rate:  75 PR Interval:  150 QRS Duration: 83 QT Interval:  355 QTC Calculation: 397 R Axis:   61 Text Interpretation: Sinus rhythm Atrial premature complexes LAE, consider biatrial enlargement Confirmed by Benjiman Core [phone]) on 06/18/2022 5:18:12 PM  Radiology DG Chest Port 1 View  Result Date: 06/18/2022 CLINICAL DATA:  Chest pain. EXAM: PORTABLE CHEST 1 VIEW COMPARISON:  November 10, 2015. FINDINGS: The heart size and mediastinal contours are within normal limits. Both lungs are clear. The visualized skeletal structures are unremarkable. IMPRESSION: No active disease. Electronically Signed   By: Lupita Raider M.D.   On: 06/18/2022 16:46     Procedures Procedures    Medications Ordered in ED Medications - No data to display  ED Course/ Medical Decision Making/ A&P                           Medical Decision Making Amount and/or Complexity of Data Reviewed Labs: ordered. Radiology: ordered.   Patient with chest pain.  Anterior chest.  Differential gnosis is long and includes coronary disease, ischemia, nonspecific chest pain, esophageal issues.  Pneumonia. Chest x-ray reassuring.  Shows no pneumonia or other clear cause of pain.  EKG reassuring and stable.  Does show some PACs but no ischemia.  No elevation in troponin.  Creatinine just slightly above baseline.  Appears stable for discharge home and outpatient follow-up.  Doubt cardiac ischemia as a cause.        Final Clinical Impression(s) / ED Diagnoses Final diagnoses:  Chest pain, unspecified type    Rx / DC Orders ED Discharge Orders     None         Benjiman Core, MD 06/18/22 1728

## 2022-06-18 NOTE — ED Notes (Signed)
Dc instructions reviewed with patient. Patient voiced understanding. Dc with belongings.  °

## 2022-06-18 NOTE — Discharge Instructions (Signed)
Your work-up was reassuring today.  Follow-up with your cardiologist as needed.

## 2022-07-21 ENCOUNTER — Other Ambulatory Visit (HOSPITAL_BASED_OUTPATIENT_CLINIC_OR_DEPARTMENT_OTHER): Payer: Self-pay | Admitting: Family

## 2022-07-21 DIAGNOSIS — I480 Paroxysmal atrial fibrillation: Secondary | ICD-10-CM

## 2022-07-21 DIAGNOSIS — D6859 Other primary thrombophilia: Secondary | ICD-10-CM

## 2022-07-21 NOTE — Telephone Encounter (Signed)
Eliquis 5mg  refill request received. Patient is 61 years old, weight-61.2kg, Crea-1.23 on 06/18/2022, Diagnosis-Afib, and last seen by Laurann Montana on 01/16/22. Dose is appropriate based on dosing criteria. Will send in refill to requested pharmacy.

## 2022-07-21 NOTE — Telephone Encounter (Signed)
Please review for refill. Thank you! 

## 2022-08-13 ENCOUNTER — Telehealth (HOSPITAL_BASED_OUTPATIENT_CLINIC_OR_DEPARTMENT_OTHER): Payer: Self-pay

## 2022-08-13 MED ORDER — ROSUVASTATIN CALCIUM 5 MG PO TABS: ORAL_TABLET | ORAL | 2 refills | Status: AC

## 2022-08-13 NOTE — Telephone Encounter (Signed)
Received fax from [facility] requesting refills for Rosuvastatin. Rx request sent to pharmacy.

## 2022-08-17 ENCOUNTER — Encounter (HOSPITAL_BASED_OUTPATIENT_CLINIC_OR_DEPARTMENT_OTHER): Payer: Self-pay

## 2022-08-17 NOTE — Telephone Encounter (Signed)
Responded to patient via separate encounter.

## 2022-09-25 ENCOUNTER — Encounter (HOSPITAL_BASED_OUTPATIENT_CLINIC_OR_DEPARTMENT_OTHER): Payer: Self-pay | Admitting: Cardiology

## 2022-09-25 ENCOUNTER — Ambulatory Visit (INDEPENDENT_AMBULATORY_CARE_PROVIDER_SITE_OTHER): Payer: 59 | Admitting: Cardiology

## 2022-09-25 VITALS — BP 128/74 | HR 78 | Ht 64.0 in | Wt 142.6 lb

## 2022-09-25 DIAGNOSIS — I10 Essential (primary) hypertension: Secondary | ICD-10-CM | POA: Diagnosis not present

## 2022-09-25 DIAGNOSIS — Z7189 Other specified counseling: Secondary | ICD-10-CM

## 2022-09-25 DIAGNOSIS — Z7901 Long term (current) use of anticoagulants: Secondary | ICD-10-CM

## 2022-09-25 DIAGNOSIS — I480 Paroxysmal atrial fibrillation: Secondary | ICD-10-CM | POA: Diagnosis not present

## 2022-09-25 DIAGNOSIS — D6869 Other thrombophilia: Secondary | ICD-10-CM

## 2022-09-25 NOTE — Progress Notes (Signed)
Cardiology Office Note:    Date:  09/25/2022   ID:  Tonya Henry, DOB [birth year], MRN [id number]  PCP:  Daisy Floro, MD  Cardiologist:  Jodelle Red, MD PhD  Referring MD: Daisy Floro, MD   CC: follow up  History of Present Illness:    Tonya Henry is a 61 y.o. female with a hx of paroxysmal atrial fibrillation (on monitor), HTN, HLD who is seen for follow up. I initially saw her 06/22/19 as a new consult at the request of Daisy Floro, MD for the evaluation and management of palpitations.  Patient was doing well at her last visit with me on 05/12/2021. There had been no recent episodes of palpitations and she had not needed any extra doses of Metoprolol at that time. We discussed her last monitor results showing 6 min of Atrial fibrillation. Her only complaint was easy bruisability on Eliquis.  Today, she states that she has been doing well overall. However, she was seen in the ED on 06/18/22 for chest pain. She reports that the pain occurred in the evening and resolved with an antacid. She had a normal work-up at that time.  She denies any recent episodes of A-fib. She denies any bleeding problems with her Eliquis apart from occasional bruising.   She reports that she sometimes gets winded when climbing up and down 2 flights of stairs but otherwise she is okay with walking.  Her blood pressure has been between 110-130 at home.   The patient denies chest pain, chest pressure, dyspnea at rest or with exertion, PND, orthopnea, or leg swelling. Denies cough, fever, chills, nausea, or vomiting. Denies syncope or presyncope. Denies dizziness or lightheadedness.     Past Medical History:  Diagnosis Date   Back pain    Endometriosis    High cholesterol    Hypertension    Lumbar herniated disc    L4 & 5   Palpitations    Radicular syndrome     Past Surgical History:  Procedure Laterality Date   BREAST REDUCTION SURGERY  2012   LAPAROSCOPIC ENDOMETRIOSIS  FULGURATION  1992   pinguecula removal  09/2015, 12/2005   REDUCTION MAMMAPLASTY Bilateral     Current Medications: Current Outpatient Medications on File Prior to Visit  Medication Sig   amLODipine (NORVASC) 5 MG tablet Take 5 mg by mouth daily.   ELIQUIS 5 MG TABS tablet TAKE 1 TABLET BY MOUTH TWICE A DAY   ezetimibe (ZETIA) 10 MG tablet Take 10 mg by mouth daily.   gabapentin (NEURONTIN) 300 MG capsule Take 300-600 mg by mouth as needed.   LINZESS 145 MCG CAPS capsule Take 145 mcg by mouth every morning.   losartan (COZAAR) 50 MG tablet Take 50 mg by mouth daily.   metoprolol succinate (TOPROL XL) 25 MG 24 hr tablet Take one 25mg  tablet with one 50mg  tablet once daily for total dose of 75mg  daily. (Patient taking differently: Take 25 mg by mouth as needed.)   metoprolol succinate (TOPROL-XL) 50 MG 24 hr tablet TAKE 1 TABLET BY MOUTH DAILY   pantoprazole (PROTONIX) 40 MG tablet Take 1 tablet (40 mg total) by mouth daily.   rosuvastatin (CRESTOR) 5 MG tablet Take 1 tablet (5 mg) by mouth 1-3 times weekly.   No current facility-administered medications on file prior to visit.     Allergies:   Patient has no known allergies.   Social History   Tobacco Use   Smoking status: Never   Smokeless tobacco:  Never  Vaping Use   Vaping Use: Never used  Substance Use Topics   Alcohol use: No    Alcohol/week: 0.0 standard drinks of alcohol   Drug use: No    Family History: The patient's family history includes Breast cancer in an other family member; Breast cancer (age of onset: 57) in her sister; Diabetes in her mother and another family member; Heart disease in her mother; Hypertension in her mother and another family member; Kidney disease in an other family member.  ROS:   Please see the history of present illness.   + Easy bruisability Additional pertinent ROS otherwise unremarkable.   EKGs/Labs/Other Studies Reviewed:    The following studies were reviewed today:  Carotid  US 02/05/22: Right Carotid: Velocities in the right ICA are consistent with a 1-39%  stenosis.  Left Carotid: Velocities in the left ICA are consistent with a 1-39%  stenosis.  Vertebrals: Bilateral vertebral arteries demonstrate antegrade flow.  Subclavians: Normal flow hemodynamics were seen in bilateral subclavian               arteries.   Echo 05/02/2021:  1. Left ventricular ejection fraction, by estimation, is 60 to 65%. The  left ventricle has normal function. The left ventricle has no regional  wall motion abnormalities. There is mild left ventricular hypertrophy.  Left ventricular diastolic parameters  were normal.   2. Right ventricular systolic function is normal. The right ventricular  size is normal.   3. The mitral valve is normal in structure. Mild mitral valve  regurgitation. No evidence of mitral stenosis.   4. The aortic valve is tricuspid. Aortic valve regurgitation is not  visualized. No aortic stenosis is present.   5. The inferior vena cava is normal in size with greater than 50%  respiratory variability, suggesting right atrial pressure of 3 mmHg.  Monitor 04/29/2021: 30 days of data recorded on [employer] monitor. Patient had a min HR of 55 bpm, max HR of 183 bpm, and avg HR of 87 bpm. Predominant underlying rhythm was Sinus Rhythm. No VT, high degree block, or pauses noted. There was a total of 6 min of atrial fibrillation detected over 30 days. Isolated ventricular ectopy was rare (<1%). Isolated atrial ectopy was occasional (9%).  Monitor 07/31/19 13 days of data recorded on Zio monitor. Patient had a min HR of 58 bpm, max HR of 207 bpm, and avg HR of 88 bpm. Predominant underlying rhythm was Sinus Rhythm. No atrial fibrillation, high degree block, or pauses noted. Isolated ventricular ectopy was rare (<1%). There were 12 triggered events, which were predominantly sinus rhythm with PACs. Monitor labeled one event as NSVT, but appears to be SVT with aberrancy as rate  was 207 beats per minute (only 7 beats long). Similar morphology to sinus beats, but QRS slightly wide, consistent with rate related aberrancy. 40 Supraventricular Tachycardia runs occurred, the run with the fastest interval lasting 4 beats with a max rate of 187 bpm, the longest lasting 24.3 secs with an avg rate of 120 bpm. PACs were frequent (5.6%, 91001).   Echo 06/30/19  1. Left ventricular ejection fraction, by visual estimation, is 60 to 65%. The left ventricle has normal function. Normal left ventricular size. There is no left ventricular hypertrophy.  2. Left ventricular diastolic Doppler parameters are consistent with impaired relaxation pattern of LV diastolic filling.  3. Global right ventricle has normal systolic function.The right ventricular size is normal.  4. Left atrial size was normal.  5.  Right atrial size was normal.  6. The mitral valve is normal in structure. Trace mitral valve regurgitation. No evidence of mitral stenosis.  7. The tricuspid valve is normal in structure. Tricuspid valve regurgitation is trivial.  8. The aortic valve is normal in structure. Aortic valve regurgitation was not visualized by color flow Doppler. Structurally normal aortic valve, with no evidence of sclerosis or stenosis.  9. The pulmonic valve was normal in structure. Pulmonic valve regurgitation is not visualized by color flow Doppler. 10. Normal pulmonary artery systolic pressure. 11. The inferior vena cava is normal in size with greater than 50% respiratory variability, suggesting right atrial pressure of 3 mmHg. 12. Normal LV systolic function; grade 1 diastolic dysfunction; mild proximal septal thickening.  EKG:  EKG is personally reviewed.   05/12/2021: EKG is not ordered today.  06/22/19: SR with PACs  Recent Labs: 06/18/2022: BUN 19; Creatinine, Ser 1.23; Hemoglobin 13.9; Platelets 289; Potassium 3.7; Sodium 138  Recent Lipid Panel No results found for: "CHOL", "TRIG", "HDL", "CHOLHDL",  "VLDL", "LDLCALC", "LDLDIRECT"  Physical Exam:    VS:  BP 128/74 (BP Location: Right Arm, Patient Position: Sitting)   Pulse 78   Ht 5\' 4"  (1.626 m)   Wt 142 lb 9.6 oz (64.7 kg)   LMP 10/06/2007 (Exact Date)   SpO2 99%   BMI 24.48 kg/m     Wt Readings from Last 3 Encounters:  09/25/22 142 lb 9.6 oz (64.7 kg)  06/18/22 135 lb (61.2 kg)  01/15/22 138 lb 14.4 oz (63 kg)    GEN: Well nourished, well developed in no acute distress HEENT: Normal, moist mucous membranes NECK: No JVD CARDIAC: RRR with rare ectopy, normal S1 and S2, no murmurs, rubs, or gallops. VASCULAR: Radial and DP pulses 2+ bilaterally. No carotid bruits RESPIRATORY:  Clear to auscultation without rales, wheezing or rhonchi  ABDOMEN: Soft, non-tender, non-distended MUSCULOSKELETAL:  Ambulates independently SKIN: Warm and dry, no edema NEUROLOGIC:  Alert and oriented x 3. No focal neuro deficits noted. PSYCHIATRIC:  Normal affect   ASSESSMENT:    1. Paroxysmal atrial fibrillation (HCC)   2. Secondary hypercoagulable state (HCC)   3. Long term current use of anticoagulant   4. Essential hypertension   5. Cardiac risk counseling      PLAN:    Palpitations Paroxysmal SVT Paroxysmal atrial fibrillation, with secondary hypercoagulable state -echo unremarkable -initial Zio with pSVT, most recent monitor with brief atrial fibrillation -CHADSVASC=2. She does bruise easily but has not had issues with bleeding -after shared decision making, will continue apixaban for long term anticoagulation  Hypertension: at goal today -continue amlodipine, losartan, metoprolol  Hypercholesterolemia: previously declined statin, now tolerating low dose rosuvastatin  Cardiac risk counseling and prevention recommendations: -recommend heart healthy/Mediterranean diet, with whole grains, fruits, vegetable, fish, lean meats, nuts, and olive oil. Limit salt. -recommend moderate walking, 3-5 times/week for 30-50 minutes each  session. Aim for at least 150 minutes.week. Goal should be pace of 3 miles/hours, or walking 1.5 miles in 30 minutes -recommend avoidance of tobacco products. Avoid excess alcohol.  Plan for follow up: 6 months or sooner if needed  Medication Adjustments/Labs and Tests Ordered: Current medicines are reviewed at length with the patient today.  Concerns regarding medicines are outlined above.   No orders of the defined types were placed in this encounter.   No orders of the defined types were placed in this encounter.   Patient Instructions  Medication Instructions:  Your physician recommends that you continue on  your current medications as directed. Please refer to the Current Medication list given to you today.  *If you need a refill on your cardiac medications before your next appointment, please call your pharmacy*  Lab Work: NONE  Testing/Procedures: NONE  Follow-Up: 03/26/2023 4:20 pm   Important Information About Sugar         I,Alexis Herring,acting as a scribe for [employer]ridgette Rawleigh Rode, MD.,have documented all relevant documentation on the behalf of Jodelle RedBridgette Rynell Ciotti, MD,as directed by  Jodelle RedBridgette Savien Mamula, MD while in the presence of Jodelle RedBridgette Laveah Gloster, MD.  I, Jodelle RedBridgette Estle Sabella, MD, have reviewed all documentation for this visit. The documentation on 10/06/22 for the exam, diagnosis, procedures, and orders are all accurate and complete.   Signed, Jodelle RedBridgette Lavere Shinsky, MD PhD 09/25/2022  [facility]

## 2022-09-25 NOTE — Patient Instructions (Signed)
Medication Instructions:  Your physician recommends that you continue on your current medications as directed. Please refer to the Current Medication list given to you today.  *If you need a refill on your cardiac medications before your next appointment, please call your pharmacy*  Lab Work: NONE  Testing/Procedures: NONE  Follow-Up: 03/26/2023 4:20 pm   Important Information About Sugar

## 2022-10-06 ENCOUNTER — Encounter (HOSPITAL_BASED_OUTPATIENT_CLINIC_OR_DEPARTMENT_OTHER): Payer: Self-pay | Admitting: Cardiology

## 2022-11-18 ENCOUNTER — Encounter (HOSPITAL_BASED_OUTPATIENT_CLINIC_OR_DEPARTMENT_OTHER): Payer: Self-pay

## 2022-11-18 ENCOUNTER — Telehealth (HOSPITAL_BASED_OUTPATIENT_CLINIC_OR_DEPARTMENT_OTHER): Payer: Self-pay

## 2022-11-18 MED ORDER — METOPROLOL SUCCINATE ER 50 MG PO TB24: ORAL_TABLET | ORAL | 3 refills | Status: AC

## 2022-11-18 MED ORDER — METOPROLOL SUCCINATE ER 25 MG PO TB24: ORAL_TABLET | ORAL | 1 refills | Status: DC

## 2022-11-18 MED ORDER — AMLODIPINE BESYLATE 5 MG PO TABS: 5.0000 mg | ORAL_TABLET | Freq: Every day | ORAL | 3 refills | Status: AC

## 2022-11-18 NOTE — Telephone Encounter (Signed)
Call to patient to let her know pharmacy has been updated to [facility].  Confirmed patient is taking Metoprolol 75 mg.  Georgana Curio MHA RN CCM

## 2022-12-02 NOTE — Telephone Encounter (Signed)
Call to patient to confirm she received her prescriptions since she did not respond to My Chart Message. Georgana Curio MHA RN CCM

## 2023-02-19 ENCOUNTER — Other Ambulatory Visit (HOSPITAL_BASED_OUTPATIENT_CLINIC_OR_DEPARTMENT_OTHER): Payer: Self-pay | Admitting: Family

## 2023-02-19 ENCOUNTER — Other Ambulatory Visit (HOSPITAL_BASED_OUTPATIENT_CLINIC_OR_DEPARTMENT_OTHER): Payer: Self-pay | Admitting: Cardiology

## 2023-02-19 DIAGNOSIS — D6859 Other primary thrombophilia: Secondary | ICD-10-CM

## 2023-02-19 DIAGNOSIS — I480 Paroxysmal atrial fibrillation: Secondary | ICD-10-CM

## 2023-02-19 NOTE — Telephone Encounter (Signed)
Prescription refill request for Eliquis received. Indication: Afib  Last office visit: 09/26/23 Cristal Deer)  Scr: 1.23 (06/18/22)  Age: 62 Weight: 64.7kg  Appropriate dose. Refill sent.

## 2023-02-19 NOTE — Telephone Encounter (Signed)
Rx request sent to pharmacy.  

## 2023-02-19 NOTE — Telephone Encounter (Signed)
Please review for refill. Thank you! 

## 2023-03-22 ENCOUNTER — Telehealth: Payer: Self-pay | Admitting: Cardiology

## 2023-03-22 DIAGNOSIS — I480 Paroxysmal atrial fibrillation: Secondary | ICD-10-CM

## 2023-03-22 DIAGNOSIS — E782 Mixed hyperlipidemia: Secondary | ICD-10-CM

## 2023-03-22 DIAGNOSIS — I10 Essential (primary) hypertension: Secondary | ICD-10-CM

## 2023-03-22 NOTE — Telephone Encounter (Signed)
*  STAT* If patient is at the pharmacy, call can be transferred to refill team.   1. Which medications need to be refilled? (please list name of each medication and dose if known) losartan (COZAAR) 50 MG tablet  2. Which pharmacy/location (including street and city if local pharmacy) is medication to be sent to? [id number] - [place], [place] - [address]  3. Do they need a 30 day or 90 day supply?  30 day supply  Patient states she will be out of medication on Friday and just needs enough to last her until her appointment with new her cardiologist because she moved to [place]. Appointment is scheduled for 7/14.

## 2023-03-22 NOTE — Telephone Encounter (Signed)
Referral placed and faxed as requested.

## 2023-03-22 NOTE — Telephone Encounter (Signed)
Patient cancelled her appointment because she moved to [place]. She is requesting to have a referral sent to [facility], Dr. Lonell Face, and she provided their contact information below. She mentions that her appointment with them is scheduled for 7/14.  Phone#: [phone] Fax#: [phone]

## 2023-03-23 MED ORDER — LOSARTAN POTASSIUM 50 MG PO TABS: 50.0000 mg | ORAL_TABLET | Freq: Every day | ORAL | 0 refills | Status: AC

## 2023-03-23 NOTE — Telephone Encounter (Signed)
Rx request sent to pharmacy.  

## 2023-03-26 ENCOUNTER — Ambulatory Visit (HOSPITAL_BASED_OUTPATIENT_CLINIC_OR_DEPARTMENT_OTHER): Payer: 59 | Admitting: Cardiology

## 2023-11-22 ENCOUNTER — Other Ambulatory Visit: Payer: Self-pay | Admitting: Cardiology

## 2024-08-23 ENCOUNTER — Other Ambulatory Visit: Payer: Self-pay | Admitting: Cardiology
